# Patient Record
Sex: Female | Born: 2000 | Race: White | Hispanic: No | Marital: Single | Smoking: Never smoker
Health system: Southern US, Community
[De-identification: ages and names within clinical notes are randomized; demographics above are authoritative.]

## PROBLEM LIST (undated history)

## (undated) DIAGNOSIS — F419 Anxiety disorder, unspecified: Secondary | ICD-10-CM

## (undated) DIAGNOSIS — F988 Other specified behavioral and emotional disorders with onset usually occurring in childhood and adolescence: Secondary | ICD-10-CM

## (undated) DIAGNOSIS — N390 Urinary tract infection, site not specified: Secondary | ICD-10-CM

## (undated) DIAGNOSIS — K219 Gastro-esophageal reflux disease without esophagitis: Secondary | ICD-10-CM

## (undated) DIAGNOSIS — N200 Calculus of kidney: Secondary | ICD-10-CM

## (undated) DIAGNOSIS — R519 Headache, unspecified: Secondary | ICD-10-CM

## (undated) DIAGNOSIS — N189 Chronic kidney disease, unspecified: Secondary | ICD-10-CM

## (undated) DIAGNOSIS — J45909 Unspecified asthma, uncomplicated: Secondary | ICD-10-CM

## (undated) HISTORY — PX: BREAST SURGERY: SHX581

## (undated) HISTORY — DX: Other specified behavioral and emotional disorders with onset usually occurring in childhood and adolescence: F98.8

## (undated) HISTORY — DX: Unspecified asthma, uncomplicated: J45.909

## (undated) HISTORY — DX: Urinary tract infection, site not specified: N39.0

## (undated) HISTORY — DX: Anxiety disorder, unspecified: F41.9

## (undated) HISTORY — DX: Headache, unspecified: R51.9

## (undated) HISTORY — DX: Chronic kidney disease, unspecified: N18.9

## (undated) HISTORY — PX: WISDOM TOOTH EXTRACTION: SHX21

## (undated) HISTORY — DX: Gastro-esophageal reflux disease without esophagitis: K21.9

## (undated) HISTORY — PX: BREAST REDUCTION SURGERY: SHX8

---
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
## 2018-01-10 DIAGNOSIS — L304 Erythema intertrigo: Secondary | ICD-10-CM | POA: Diagnosis not present

## 2018-01-10 DIAGNOSIS — L308 Other specified dermatitis: Secondary | ICD-10-CM | POA: Diagnosis not present

## 2018-02-18 DIAGNOSIS — N62 Hypertrophy of breast: Secondary | ICD-10-CM | POA: Diagnosis not present

## 2018-04-26 DIAGNOSIS — Z68.41 Body mass index (BMI) pediatric, 5th percentile to less than 85th percentile for age: Secondary | ICD-10-CM | POA: Diagnosis not present

## 2018-04-26 DIAGNOSIS — Z713 Dietary counseling and surveillance: Secondary | ICD-10-CM | POA: Diagnosis not present

## 2018-04-26 DIAGNOSIS — Z0000 Encounter for general adult medical examination without abnormal findings: Secondary | ICD-10-CM | POA: Diagnosis not present

## 2018-06-30 DIAGNOSIS — Z3046 Encounter for surveillance of implantable subdermal contraceptive: Secondary | ICD-10-CM | POA: Diagnosis not present

## 2019-01-02 ENCOUNTER — Other Ambulatory Visit: Payer: Self-pay | Admitting: *Deleted

## 2019-01-02 DIAGNOSIS — Z20822 Contact with and (suspected) exposure to covid-19: Secondary | ICD-10-CM

## 2019-01-03 LAB — NOVEL CORONAVIRUS, NAA: SARS-CoV-2, NAA: NOT DETECTED

## 2019-02-03 ENCOUNTER — Other Ambulatory Visit: Payer: Self-pay

## 2019-02-03 DIAGNOSIS — Z20822 Contact with and (suspected) exposure to covid-19: Secondary | ICD-10-CM

## 2019-02-04 LAB — NOVEL CORONAVIRUS, NAA: SARS-CoV-2, NAA: NOT DETECTED

## 2019-05-04 ENCOUNTER — Other Ambulatory Visit: Payer: Self-pay

## 2019-05-04 ENCOUNTER — Emergency Department
Admission: EM | Admit: 2019-05-04 | Discharge: 2019-05-04 | Disposition: A | Discharge status: Home / Self Care | Payer: 59 | Attending: Emergency Medicine | Admitting: Emergency Medicine

## 2019-05-04 ENCOUNTER — Encounter: Payer: Self-pay | Admitting: Emergency Medicine

## 2019-05-04 ENCOUNTER — Emergency Department: Payer: 59

## 2019-05-04 DIAGNOSIS — Z79899 Other long term (current) drug therapy: Secondary | ICD-10-CM | POA: Insufficient documentation

## 2019-05-04 DIAGNOSIS — R109 Unspecified abdominal pain: Secondary | ICD-10-CM | POA: Diagnosis present

## 2019-05-04 DIAGNOSIS — N201 Calculus of ureter: Secondary | ICD-10-CM

## 2019-05-04 LAB — URINALYSIS, COMPLETE (UACMP) WITH MICROSCOPIC
Bilirubin Urine: NEGATIVE
Glucose, UA: NEGATIVE mg/dL
Ketones, ur: NEGATIVE mg/dL
Leukocytes,Ua: NEGATIVE
Nitrite: NEGATIVE
Protein, ur: 30 mg/dL — AB
RBC / HPF: >50 RBC/hpf — ABNORMAL HIGH (ref 0–5)
Specific Gravity, Urine: 1.027 (ref 1.005–1.030)
pH: 5 (ref 5.0–8.0)

## 2019-05-04 LAB — COMPREHENSIVE METABOLIC PANEL
ALT: 18 U/L (ref 0–44)
AST: 15 U/L (ref 15–41)
Albumin: 4.1 g/dL (ref 3.5–5.0)
Alkaline Phosphatase: 60 U/L (ref 38–126)
Anion gap: 7 (ref 5–15)
BUN: 16 mg/dL (ref 6–20)
CO2: 26 mmol/L (ref 22–32)
Calcium: 9.2 mg/dL (ref 8.9–10.3)
Chloride: 107 mmol/L (ref 98–111)
Creatinine, Ser: 0.64 mg/dL (ref 0.44–1.00)
GFR calc Af Amer: >60 mL/min (ref >60)
GFR calc non Af Amer: >60 mL/min (ref >60)
Glucose, Bld: 108 mg/dL — ABNORMAL HIGH (ref 70–99)
Potassium: 3.7 mmol/L (ref 3.5–5.1)
Sodium: 140 mmol/L (ref 135–145)
Total Bilirubin: 0.7 mg/dL (ref 0.3–1.2)
Total Protein: 6.9 g/dL (ref 6.5–8.1)

## 2019-05-04 LAB — CBC WITH DIFFERENTIAL/PLATELET
Abs Immature Granulocytes: 0.01 10*3/uL (ref 0.00–0.07)
Basophils Absolute: 0 10*3/uL (ref 0.0–0.1)
Basophils Relative: 1 %
Eosinophils Absolute: 0.1 10*3/uL (ref 0.0–0.5)
Eosinophils Relative: 1 %
HCT: 44 % (ref 36.0–46.0)
Hemoglobin: 14.6 g/dL (ref 12.0–15.0)
Immature Granulocytes: 0 %
Lymphocytes Relative: 35 %
Lymphs Abs: 2.9 10*3/uL (ref 0.7–4.0)
MCH: 29 pg (ref 26.0–34.0)
MCHC: 33.2 g/dL (ref 30.0–36.0)
MCV: 87.5 fL (ref 80.0–100.0)
Monocytes Absolute: 0.8 10*3/uL (ref 0.1–1.0)
Monocytes Relative: 10 %
Neutro Abs: 4.4 10*3/uL (ref 1.7–7.7)
Neutrophils Relative %: 53 %
Platelets: 322 10*3/uL (ref 150–400)
RBC: 5.03 MIL/uL (ref 3.87–5.11)
RDW: 11.8 % (ref 11.5–15.5)
WBC: 8.2 10*3/uL (ref 4.0–10.5)
nRBC: 0 % (ref 0.0–0.2)

## 2019-05-04 LAB — POCT PREGNANCY, URINE: Preg Test, Ur: NEGATIVE

## 2019-05-04 MED ORDER — ONDANSETRON HCL 4 MG/2ML IJ SOLN
4.0000 mg | Freq: Once | INTRAMUSCULAR | Status: AC
  Administered 2019-05-04: 03:00:00 4 mg via INTRAVENOUS
  Filled 2019-05-04: qty 2

## 2019-05-04 MED ORDER — ONDANSETRON 4 MG PO TBDP: 4.0000 mg | ORAL_TABLET | Freq: Three times a day (TID) | ORAL | 0 refills | Status: DC | PRN

## 2019-05-04 MED ORDER — KETOROLAC TROMETHAMINE 30 MG/ML IJ SOLN
30.0000 mg | Freq: Once | INTRAMUSCULAR | Status: AC
  Administered 2019-05-04: 04:00:00 30 mg via INTRAVENOUS
  Filled 2019-05-04: qty 1

## 2019-05-04 MED ORDER — SODIUM CHLORIDE 0.9 % IV BOLUS
1000.0000 mL | Freq: Once | INTRAVENOUS | Status: AC
  Administered 2019-05-04: 04:00:00 1000 mL via INTRAVENOUS

## 2019-05-04 MED ORDER — OXYCODONE-ACETAMINOPHEN 5-325 MG PO TABS: 1.0000 | ORAL_TABLET | ORAL | 0 refills | Status: DC | PRN

## 2019-05-04 MED ORDER — TAMSULOSIN HCL 0.4 MG PO CAPS: 0.4000 mg | ORAL_CAPSULE | Freq: Every day | ORAL | 0 refills | Status: AC

## 2019-05-04 MED ORDER — MORPHINE SULFATE (PF) 4 MG/ML IV SOLN
4.0000 mg | Freq: Once | INTRAVENOUS | Status: AC
  Administered 2019-05-04: 03:00:00 2 mg via INTRAVENOUS
  Filled 2019-05-04: qty 1

## 2019-05-04 NOTE — ED Provider Notes (Signed)
[facility] Emergency Department Provider Note  ____________________________________________   First MD Initiated Contact with Patient 05/04/19 [phone]     (approximate)  I have reviewed the triage vital signs and the nursing notes.   HISTORY  Chief Complaint Flank Pain    HPI Kelly Bennett is a 19 y.o. female with below list of previous medical conditions presents to the emergency department secondary to intermittent right flank pain x3 days with acute worsening tonight at 1:45 AM.  Patient states that pain was 9 out of 10 currently 7 out of 10.  Patient states that pain improved since taking meloxicam and hydrocodone before arrival to the emergency department.  Patient denies any hematuria dysuria fever.  Patient denies any nausea or vomiting.        History reviewed. No pertinent past medical history.  Patient Active Problem List   Diagnosis Date Noted  . Acute right ankle pain 07/05/2016  . Hypermobility syndrome 07/05/2016    Past Surgical History:  Procedure Laterality Date  . BREAST REDUCTION SURGERY    . WISDOM TOOTH EXTRACTION      Prior to Admission medications   Medication Sig Start Date End Date Taking? Authorizing Provider  amphetamine-dextroamphetamine (ADDERALL) 5 MG tablet Take 5 mg by mouth daily.    [provider]  lisdexamfetamine (VYVANSE) 40 MG capsule Take 40 mg by mouth once.    [provider]  meloxicam (MOBIC) 15 MG tablet Take 1 tablet (15 mg total) by mouth daily. Patient not taking: Reported on 07/14/2017 05/06/17   Cuthriell, Delorise Royals, PA-C    Allergies Patient has no known allergies.  No family history on file.  Social History Social History   Tobacco Use  . Smoking status: Never Smoker  . Smokeless tobacco: Never Used  Substance Use Topics  . Alcohol use: No  . Drug use: No    Review of Systems Constitutional: No fever/chills Eyes: No visual changes. ENT: No sore throat.  Cardiovascular: Denies chest pain. Respiratory: Denies shortness of breath. Gastrointestinal: Positive for abdominal and right flank pain no nausea, no vomiting.  No diarrhea.  No constipation. Genitourinary: Negative for dysuria. Musculoskeletal: Negative for neck pain.  Negative for back pain. Integumentary: Negative for rash. Neurological: Negative for headaches, focal weakness or numbness.  ____________________________________________   PHYSICAL EXAM:  VITAL SIGNS: ED Triage Vitals  Enc Vitals Group     BP 05/04/19 0213 127/82     Pulse Rate 05/04/19 0213 82     Resp 05/04/19 0213 18     Temp 05/04/19 0213 97.9 F (36.6 C)     Temp Source 05/04/19 0213 Oral     SpO2 05/04/19 0213 96 %     Weight 05/04/19 0214 77.1 kg (170 lb)     Height 05/04/19 0214 1.676 m (5\' 6" )     Head Circumference --      Peak Flow --      Pain Score 05/04/19 0224 9     Pain Loc --      Pain Edu? --      Excl. in GC? --     Constitutional: Alert and oriented.  Eyes: Conjunctivae are normal.  Mouth/Throat: Patient is wearing a mask. Neck: No stridor.  No meningeal signs.   Cardiovascular: Normal rate, regular rhythm. Good peripheral circulation. Grossly normal heart sounds. Respiratory: Normal respiratory effort.  No retractions. Gastrointestinal: Soft and nontender. No distention.  Musculoskeletal: No lower extremity tenderness nor edema. No gross deformities of  extremities. Neurologic:  Normal speech and language. No gross focal neurologic deficits are appreciated.  Skin:  Skin is warm, dry and intact. Psychiatric: Mood and affect are normal. Speech and behavior are normal.  ____________________________________________   LABS (all labs ordered are listed, but only abnormal results are displayed)  Labs Reviewed  COMPREHENSIVE METABOLIC PANEL - Abnormal; Notable for the following components:      Result Value   Glucose, Bld 108 (*)    All other components within normal limits   URINALYSIS, COMPLETE (UACMP) WITH MICROSCOPIC - Abnormal; Notable for the following components:   Color, Urine YELLOW (*)    APPearance HAZY (*)    Hgb urine dipstick LARGE (*)    Protein, ur 30 (*)    RBC / HPF >50 (*)    Bacteria, UA RARE (*)    All other components within normal limits  CBC WITH DIFFERENTIAL/PLATELET  POCT PREGNANCY, URINE     RADIOLOGY I, [place], personally viewed and evaluated these images (plain radiographs) as part of my medical decision making, as well as reviewing the written report by the radiologist.  ED MD interpretation: 3 mm proximal ureteral lithiasis with hydronephrosis per radiologist on CT impression per radiologist.  Official radiology report(s): CT Renal Stone Study  Result Date: 05/04/2019 CLINICAL DATA:  Right-sided flank pain EXAM: CT ABDOMEN AND PELVIS WITHOUT CONTRAST TECHNIQUE: Multidetector CT imaging of the abdomen and pelvis was performed following the standard protocol without IV contrast. COMPARISON:  None. FINDINGS: Lower chest: No acute abnormality. Hepatobiliary: No focal liver abnormality is seen. No gallstones, gallbladder wall thickening, or biliary dilatation. Pancreas: Unremarkable. No pancreatic ductal dilatation or surrounding inflammatory changes. Spleen: Normal in size without focal abnormality. Adrenals/Urinary Tract: Adrenal glands are within normal limits. Left kidney is unremarkable. Right kidney demonstrates hydronephrosis secondary to a small proximal right ureteral stone measuring 2-3 mm. The more distal right ureter appears within normal limits. Bladder is decompressed. Stomach/Bowel: The appendix is within normal limits. No obstructive or inflammatory changes of the large or small bowel are seen. Stomach is within normal limits. Vascular/Lymphatic: No significant vascular findings are present. No enlarged abdominal or pelvic lymph nodes. Reproductive: Uterus and bilateral adnexa are unremarkable. Other: No abdominal  wall hernia or abnormality. No abdominopelvic ascites. Musculoskeletal: No acute or significant osseous findings. IMPRESSION: Proximal right ureteral stone with hydronephrosis. Electronically Signed   By: Alcide Clever M.D.   On: 05/04/2019 03:08    ______  Procedures   ____________________________________________   INITIAL IMPRESSION / MDM / ASSESSMENT AND PLAN / ED COURSE  As part of my medical decision making, I reviewed the following data within the electronic MEDICAL RECORD NUMBER   18 year old female presented with above-stated history and physical exam consistent with possible cholelithiasis which was confirmed on CT.  Patient given IV morphine 2 mg, Zofran 4 mg IV and Toradol 30 mg IV.  1 L IV normal saline administered.  Pain improved at this time.  Patient will be referred to urology for further outpatient evaluation       ____________________________________________  FINAL CLINICAL IMPRESSION(S) / ED DIAGNOSES  Final diagnoses:  Ureterolithiasis     MEDICATIONS GIVEN DURING THIS VISIT:  Medications  ketorolac (TORADOL) 30 MG/ML injection 30 mg (has no administration in time range)  sodium chloride 0.9 % bolus 1,000 mL (has no administration in time range)  morphine 4 MG/ML injection 4 mg (2 mg Intravenous Given 05/04/19 0316)  ondansetron (ZOFRAN) injection 4 mg (4 mg Intravenous  Given 05/04/19 0316)     ED Discharge Orders    None      *Please note:  Adamary Savary was evaluated in Emergency Department on 05/04/2019 for the symptoms described in the history of present illness. She was evaluated in the context of the global COVID-19 pandemic, which necessitated consideration that the patient might be at risk for infection with the SARS-CoV-2 virus that causes COVID-19. Institutional protocols and algorithms that pertain to the evaluation of patients at risk for COVID-19 are in a state of rapid change based on information released by regulatory bodies including the CDC and  federal and state organizations. These policies and algorithms were followed during the patient's care in the ED.  Some ED evaluations and interventions may be delayed as a result of limited staffing during the pandemic.*  Note:  This document was prepared using Dragon voice recognition software and may include unintentional dictation errors.   Gregor Hams, MD 05/04/19 [phone]

## 2019-05-04 NOTE — ED Triage Notes (Signed)
Pt to triage via w/c with no distress noted, mask in place; pt reports rt flank pain radiating into abd tonight accomp by nausea; awoke at 145am with increasing pain; meloxicam and hydrocodone PTA without relief; denies hx of same

## 2019-05-04 NOTE — ED Notes (Signed)
Pt transferred to CT.

## 2019-05-25 ENCOUNTER — Other Ambulatory Visit: Payer: 59

## 2019-09-27 ENCOUNTER — Ambulatory Visit (INDEPENDENT_AMBULATORY_CARE_PROVIDER_SITE_OTHER): Payer: 59 | Admitting: Nurse Practitioner

## 2019-09-27 ENCOUNTER — Encounter: Payer: Self-pay | Admitting: Nurse Practitioner

## 2019-09-27 ENCOUNTER — Other Ambulatory Visit: Payer: Self-pay

## 2019-09-27 VITALS — BP 112/74 | HR 81 | Temp 98.1°F | Ht 66.0 in | Wt 182.0 lb

## 2019-09-27 DIAGNOSIS — Z1159 Encounter for screening for other viral diseases: Secondary | ICD-10-CM | POA: Diagnosis not present

## 2019-09-27 DIAGNOSIS — F988 Other specified behavioral and emotional disorders with onset usually occurring in childhood and adolescence: Secondary | ICD-10-CM | POA: Diagnosis not present

## 2019-09-27 DIAGNOSIS — Z0000 Encounter for general adult medical examination without abnormal findings: Secondary | ICD-10-CM | POA: Diagnosis not present

## 2019-09-27 DIAGNOSIS — Z23 Encounter for immunization: Secondary | ICD-10-CM | POA: Diagnosis not present

## 2019-09-27 DIAGNOSIS — E538 Deficiency of other specified B group vitamins: Secondary | ICD-10-CM

## 2019-09-27 DIAGNOSIS — F419 Anxiety disorder, unspecified: Secondary | ICD-10-CM | POA: Diagnosis not present

## 2019-09-27 LAB — LIPID PANEL
Cholesterol: 128 mg/dL (ref 0–200)
HDL: 60.8 mg/dL (ref >39.00)
LDL Cholesterol: 56 mg/dL (ref 0–99)
NonHDL: 66.97
Total CHOL/HDL Ratio: 2
Triglycerides: 56 mg/dL (ref 0.0–149.0)
VLDL: 11.2 mg/dL (ref 0.0–40.0)

## 2019-09-27 LAB — TSH: TSH: 1.5 u[IU]/mL (ref 0.35–4.50)

## 2019-09-27 LAB — B12 AND FOLATE PANEL
Folate: 17.3 ng/mL (ref >5.9)
Vitamin B-12: 180 pg/mL — ABNORMAL LOW (ref 211–911)

## 2019-09-27 LAB — VITAMIN D 25 HYDROXY (VIT D DEFICIENCY, FRACTURES): VITD: 30.41 ng/mL (ref 30.00–100.00)

## 2019-09-27 LAB — HEMOGLOBIN A1C: Hgb A1c MFr Bld: 5.3 % (ref 4.6–6.5)

## 2019-09-27 NOTE — Patient Instructions (Addendum)
Please go to the lab today.  I have placed referral to psychiatry- Dr. Shea Evans for anxiety and ADD evaluation and management.  Continue with healthy lifestyle, diet and exercise regular sunscreen use.  Visit the dentist every 56-monththe eye doctor once a year.  For your first Pap smear at age [age]please arrange appointment with GYN  at your convenience.   You are due for the tetanus vaccine now- Tdap is recommended.     Preventive Care 152271Years Old, Female Preventive care refers to lifestyle choices and visits with your health care provider that can promote health and wellness. At this stage in your life, you may start seeing a primary care physician instead of a pediatrician. Your health care is now your responsibility. Preventive care for young adults includes:  A yearly physical exam. This is also called an annual wellness visit.  Regular dental and eye exams.  Immunizations.  Screening for certain conditions.  Healthy lifestyle choices, such as diet and exercise. What can I expect for my preventive care visit? Physical exam Your health care provider may check:  Height and weight. These may be used to calculate body mass index (BMI), which is a measurement that tells if you are at a healthy weight.  Heart rate and blood pressure.  Body temperature. Counseling Your health care provider may ask you questions about:  Past medical problems and family medical history.  Alcohol, tobacco, and drug use.  Home and relationship well-being.  Access to firearms.  Emotional well-being.  Diet, exercise, and sleep habits.  Sexual activity and sexual health.  Method of birth control.  Menstrual cycle.  Pregnancy history. What immunizations do I need?  Influenza (flu) vaccine  This is recommended every year. Tetanus, diphtheria, and pertussis (Tdap) vaccine  You may need a Td booster every 10 years. Varicella (chickenpox) vaccine  You may need this vaccine if you  have not already been vaccinated. Human papillomavirus (HPV) vaccine  If recommended by your health care provider, you may need three doses over 6 months. Measles, mumps, and rubella (MMR) vaccine  You may need at least one dose of MMR. You may also need a second dose. Meningococcal conjugate (MenACWY) vaccine  One dose is recommended if you are 89221years old and a fMarket researcherliving in a residence hall, or if you have one of several medical conditions. You may also need additional booster doses. Pneumococcal conjugate (PCV13) vaccine  You may need this if you have certain conditions and were not previously vaccinated. Pneumococcal polysaccharide (PPSV23) vaccine  You may need one or two doses if you smoke cigarettes or if you have certain conditions. Hepatitis A vaccine  You may need this if you have certain conditions or if you travel or work in places where you may be exposed to hepatitis A. Hepatitis B vaccine  You may need this if you have certain conditions or if you travel or work in places where you may be exposed to hepatitis B. Haemophilus influenzae type b (Hib) vaccine  You may need this if you have certain risk factors. You may receive vaccines as individual doses or as more than one vaccine together in one shot (combination vaccines). Talk with your health care provider about the risks and benefits of combination vaccines. What tests do I need? Blood tests  Lipid and cholesterol levels. These may be checked every 5 years starting at age 21  Hepatitis C test.  Hepatitis B test. Screening  Pelvic exam and  Pap test. This may be done every 3 years starting at age 50.  Sexually transmitted disease (STD) testing, if you are at risk.  BRCA-related cancer screening. This may be done if you have a family history of breast, ovarian, tubal, or peritoneal cancers. Other tests  Tuberculosis skin test.  Vision and hearing tests.  Skin exam.  Breast  exam. Follow these instructions at home: Eating and drinking   Eat a diet that includes fresh fruits and vegetables, whole grains, lean protein, and low-fat dairy products.  Drink enough fluid to keep your urine pale yellow.  Do not drink alcohol if: ? Your health care provider tells you not to drink. ? You are pregnant, may be pregnant, or are planning to become pregnant. ? You are under the legal drinking age. In the [place] the legal drinking age is 2.  If you drink alcohol: ? Limit how much you have to 0-1 drink a day. ? Be aware of how much alcohol is in your drink. In the [place] one drink equals one 12 oz bottle of beer (355 mL), one 5 oz glass of wine (148 mL), or one 1 oz glass of hard liquor (44 mL). Lifestyle  Take daily care of your teeth and gums.  Stay active. Exercise at least 30 minutes 5 or more days of the week.  Do not use any products that contain nicotine or tobacco, such as cigarettes, e-cigarettes, and chewing tobacco. If you need help quitting, ask your health care provider.  Do not use drugs.  If you are sexually active, practice safe sex. Use a condom or other form of birth control (contraception) in order to prevent pregnancy and STIs (sexually transmitted infections). If you plan to become pregnant, see your health care provider for a pre-conception visit.  Find healthy ways to cope with stress, such as: ? Meditation, yoga, or listening to music. ? Journaling. ? Talking to a trusted person. ? Spending time with friends and family. Safety  Always wear your seat belt while driving or riding in a vehicle.  Do not drive if you have been drinking alcohol. Do not ride with someone who has been drinking.  Do not drive when you are tired or distracted. Do not text while driving.  Wear a helmet and other protective equipment during sports activities.  If you have firearms in your house, make sure you follow all gun safety procedures.  Seek help if you  have been bullied, physically abused, or sexually abused.  Use the Internet responsibly to avoid dangers such as online bullying and online sex predators. What's next?  Go to your health care provider once a year for a well check visit.  Ask your health care provider how often you should have your eyes and teeth checked.  Stay up to date on all vaccines. This information is not intended to replace advice given to you by your health care provider. Make sure you discuss any questions you have with your health care provider. Document Revised: 02/10/2018 Document Reviewed: 02/10/2018 Elsevier Patient Education  2020 [employer].

## 2019-09-27 NOTE — Progress Notes (Signed)
New Patient Office Visit  Subjective:  Patient ID: Kelly Bennett, female    DOB: 06-30-2000  Age: 19 y.o. MRN: [id number]  CC:  Chief Complaint  Patient presents with  . New Patient (Initial Visit)    establish care    HPI Kelly Bennett is a healthy 19 year old with past history of asthma, ADHD, anxiety, GERD, nephrolithiasis, Covid infection who presents to establish care with primary care provider.  She needs to have a routine physical exam with her first  pap test.  She comes in today with her mother Kelly Bennett.      Asthma: Patient has a history of mild asthma if she overexerted herself.  She has no specific allergies.  She reports a mild case of Covid viral infection in March 2021.  She does keep an albuterol inhaler on hand.  She is currently asymptomatic.  Non-smoker.  ADHD: Diagnosed and treated by her pediatric attending.  She is currently on no medication and does not desire to go back on medication at this time.  She did take Vyvanse 50 mg for 3 years in 2017 and loved the medication as it controlled her ADHD when she was in school. She would take an additional Adderall 15 mg in the evening if needed to help her with school work. Insurance stopped covering it. She then tried Focalin and took that for one week,  but it made her feel like a walking zombie so she stopped it.  She has found ways to manage her ADHD without medication and is doing well.  She will have anxiety that comes and goes.  She can wake up in the morning before her day has begun and can tell whether it is going to be in anxiety daily or not.  She manages this with lifestyle. She feels she is doing very well overall. PHQ-9: 16 and GAD-7: 16. No SI/HI.    Social: Patient single, living with her parents, planning on getting married 12/14/2020.  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  History of previous tobacco use.  Reports social alcohol.   Immunizations: UTD. She tested positive for Covid 05/2019.She has  declined the Covid vaccine at this time.  Diet:healthy, not a vegan, eats bread Exercise: Pap Smear:not yet. She has a Nexplanon in place for birth control. Dentist: UTD Vision: UTD-contacts  Past Medical History:  Diagnosis Date  . ADD (attention deficit disorder)    Diagnosed age 62 - probably showed signs of not focusing, short attn span, easily distracted  . Anxiety   . Asthma   . Chronic kidney disease    kidney stones 06/2019- maybe passed them  . Frequent headaches   . GERD (gastroesophageal reflux disease)   . UTI (urinary tract infection)     Past Surgical History:  Procedure Laterality Date  . BREAST REDUCTION SURGERY    . WISDOM TOOTH EXTRACTION      Family History  Problem Relation Age of Onset  . Arthritis Mother   . Depression Mother   . Hypertension Mother   . Miscarriages / [place] Mother   . Arthritis Father   . Cancer Father   . Hypertension Father   . Arthritis Maternal Grandmother   . Hypertension Maternal Grandmother   . Alcohol abuse Maternal Grandfather     Social History   Socioeconomic History  . Marital status: Single    Spouse name: Not on file  . Number of children: Not on file  . Years of education:  Not on file  . Highest education level: High school graduate  Occupational History  . Occupation: Leisure centre managerbartender  . Occupation: retail   Tobacco Use  . Smoking status: Former Games developermoker  . Smokeless tobacco: Never Used  Vaping Use  . Vaping Use: Never used  Substance and Sexual Activity  . Alcohol use: Yes  . Drug use: Never  . Sexual activity: Not Currently    Birth control/protection: Implant  Other Topics Concern  . Not on file  Social History Narrative   Single, living with parents, getting married 12/14/2020   Social Determinants of Health   Financial Resource Strain:   . Difficulty of Paying Living Expenses:   Food Insecurity:   . Worried About Programme researcher, broadcasting/film/videounning Out of Food in the Last Year:   . Baristaan Out of Food in the Last Year:     Transportation Needs:   . Freight forwarderLack of Transportation (Medical):   Marland Kitchen. Lack of Transportation (Non-Medical):   Physical Activity:   . Days of Exercise per Week:   . Minutes of Exercise per Session:   Stress:   . Feeling of Stress :   Social Connections:   . Frequency of Communication with Friends and Family:   . Frequency of Social Gatherings with Friends and Family:   . Attends Religious Services:   . Active Member of Clubs or Organizations:   . Attends BankerClub or Organization Meetings:   Marland Kitchen. Marital Status:   Intimate Partner Violence:   . Fear of Current or Ex-Partner:   . Emotionally Abused:   Marland Kitchen. Physically Abused:   . Sexually Abused:     ROS Review of Systems  Constitutional: Negative for chills and fever.  HENT: Negative.   Eyes: Negative.   Respiratory: Negative.  Negative for cough, shortness of breath and wheezing.   Cardiovascular: Negative.   Gastrointestinal: Negative.  Negative for abdominal pain, blood in stool, diarrhea, nausea and vomiting.  Endocrine: Negative.   Genitourinary: Negative.        LMP June erratic on Nexplanon  Musculoskeletal: Negative.   Skin: Negative.   Allergic/Immunologic: Negative.   Neurological: Negative.   Hematological: Negative.   Psychiatric/Behavioral:       Positive per HPI    Objective:   Today's Vitals: BP 112/74 (BP Location: Left Arm, Patient Position: Sitting, Cuff Size: Normal)   Pulse 81   Temp 98.1 F (36.7 C) (Oral)   Ht 5\' 6"  (1.676 m)   Wt 182 lb (82.6 kg)   SpO2 98%   BMI 29.38 kg/m   Physical Exam Constitutional:      Appearance: Normal appearance. She is normal weight.  HENT:     Head: Normocephalic and atraumatic.     Right Ear: Tympanic membrane normal.     Left Ear: Tympanic membrane normal.  Eyes:     Conjunctiva/sclera: Conjunctivae normal.     Pupils: Pupils are equal, round, and reactive to light.  Cardiovascular:     Rate and Rhythm: Normal rate and regular rhythm.     Pulses: Normal pulses.      Heart sounds: Normal heart sounds.  Pulmonary:     Effort: Pulmonary effort is normal.     Breath sounds: Normal breath sounds.  Abdominal:     Palpations: Abdomen is soft.     Tenderness: There is no abdominal tenderness.  Musculoskeletal:        General: Normal range of motion.     Cervical back: Normal range of motion and neck supple.  Skin:    General: Skin is warm and dry.  Neurological:     General: No focal deficit present.     Mental Status: She is alert and oriented to person, place, and time.  Psychiatric:        Mood and Affect: Mood normal.        Behavior: Behavior normal.        Thought Content: Thought content normal.        Judgment: Judgment normal.     Comments: PHQ-9: 16 GAD-7: 16, No SI/HI.      Assessment & Plan:   Problem List Items Addressed This Visit    None    Visit Diagnoses    Encounter for medical examination to establish care    -  Primary   Relevant Orders   Lipid panel (Completed)   TSH (Completed)   Hemoglobin A1c (Completed)   Anxiety       Relevant Orders   VITAMIN D 25 Hydroxy (Vit-D Deficiency, Fractures) (Completed)   B12 and Folate Panel (Completed)   Ambulatory referral to Psychiatry   Attention deficit disorder, unspecified hyperactivity presence       Relevant Orders   Ambulatory referral to Psychiatry   Need for hepatitis C screening test       Relevant Orders   Hepatitis C Antibody (Completed)   Need for Tdap vaccination       Relevant Orders   Tdap vaccine greater than or equal to 7yo IM (Completed)   B12 deficiency       Relevant Orders   B12 and Folate Panel      Outpatient Encounter Medications as of 09/27/2019  Medication Sig  . albuterol (VENTOLIN HFA) 108 (90 Base) MCG/ACT inhaler Inhale into the lungs.  Marland Kitchen etonogestrel (NEXPLANON) 68 MG IMPL implant Inject into the skin.  . [DISCONTINUED] amphetamine-dextroamphetamine (ADDERALL) 5 MG tablet Take 5 mg by mouth daily.  . [DISCONTINUED] lisdexamfetamine  (VYVANSE) 40 MG capsule Take 40 mg by mouth once.  . [DISCONTINUED] meloxicam (MOBIC) 15 MG tablet Take 1 tablet (15 mg total) by mouth daily. (Patient not taking: Reported on 07/14/2017)  . [DISCONTINUED] ondansetron (ZOFRAN ODT) 4 MG disintegrating tablet Take 1 tablet (4 mg total) by mouth every 8 (eight) hours as needed.  . [DISCONTINUED] oxyCODONE-acetaminophen (PERCOCET) 5-325 MG tablet Take 1 tablet by mouth every 4 (four) hours as needed.   No facility-administered encounter medications on file as of 09/27/2019.   Please go to the lab today. We discussed her elevated anxiety and depression scores today. I have placed referral to psychiatry- Dr. Elna Breslow for anxiety and ADD evaluation and management.  Continue with healthy lifestyle, diet and exercise regular sunscreen use.   Visit the dentist every 33-month the eye doctor once a year.   She needs her first GYN exam and pap test. We can provide that here in primary care, but would send her to GYN when she needs the Nexplanon changed. She does have erratic periods with the Nexplanon. She decided to get established with GYN for her Pap and routine birth control needs and do it all in one location. She will make an appt at her convenience.    You are due for the tetanus vaccine now- Tdap is recommended.   10/01/2019: Addendum:  Lab results: Telephone call and she was given the normal Vit D, lipid, thyroid, glucose labs. B12 is low and advised to begin B12 1000 mcg daily and repeat B12 level in 3 mos. She  is in agreement and will begin the supplements.  Repeat B12 level in 3 mos.   10/01/2019: Addendum: We also discussed her history of right flank pain in March with proximal right ureteral stone with hydronephrosis on CT renal stone study 05/04/19:She thinks she passed the stone as her right flank pain resolved. She did not see the need to see Urology as recommended by the ED provider. Plan:  I advised that she see Urology to f/up on the hydronephrosis  seen.  She will consider this recommendation and will let me know if she wants a referral.   Follow-up: Return in about 1 year (around 09/26/2020).  This visit occurred during the SARS-CoV-2 public health emergency.  Safety protocols were in place, including screening questions prior to the visit, additional usage of staff PPE, and extensive cleaning of exam room while observing appropriate contact time as indicated for disinfecting solutions.   Amedeo Kinsman, NP

## 2019-09-28 LAB — HEPATITIS C ANTIBODY
Hepatitis C Ab: NONREACTIVE
SIGNAL TO CUT-OFF: 0.01 (ref <1.00)

## 2019-10-01 ENCOUNTER — Telehealth: Payer: Self-pay | Admitting: Nurse Practitioner

## 2019-10-01 ENCOUNTER — Encounter: Payer: Self-pay | Admitting: Nurse Practitioner

## 2019-10-01 NOTE — Telephone Encounter (Signed)
Lab results: She was given the normal Vit D, lipid, thyroid, glucose labs. B12 is low and advised to begin B12 1000 mcg daily and repeat B12 level in 3 mos. She is in agreement and will begin the supplements.  Repeat lab appt for 3 mos. The order is placed.  Hx of Rt flank pain in March with proximal right ureteral stone with hydronephrosis on CT renal stone study 05/04/19: Also, today, we reviewed her CT -renal stone study from 05/04/19 and she thinks she passed the stone as her right flank pain resolved. She did not see the need to see Urology as recommended by the ED provider.  Plan:  I advised that she see Urology to f/up on the hydronephrosis seen. She will consider this recommendation and will let me know if she wants a referral.

## 2019-11-02 ENCOUNTER — Telehealth: Payer: Self-pay | Admitting: Nurse Practitioner

## 2019-11-02 NOTE — Telephone Encounter (Signed)
LMTCB

## 2019-11-02 NOTE — Telephone Encounter (Signed)
Please call her and ask her to schedule an appt- or did she find a different provider?  

## 2019-11-02 NOTE — Telephone Encounter (Signed)
Rejection Reason - Patient did not respond" [facility] said about 18 hours ago 

## 2019-11-08 NOTE — Telephone Encounter (Signed)
LMTCB

## 2019-11-11 IMAGING — DX DG ANKLE COMPLETE 3+V*R*
3 series · 3 of 3 positions shown · non-contrast
Comparison: None.

CLINICAL DATA: Leg pain after fall

EXAM:
RIGHT ANKLE - COMPLETE 3+ VIEW

[ankle ap]
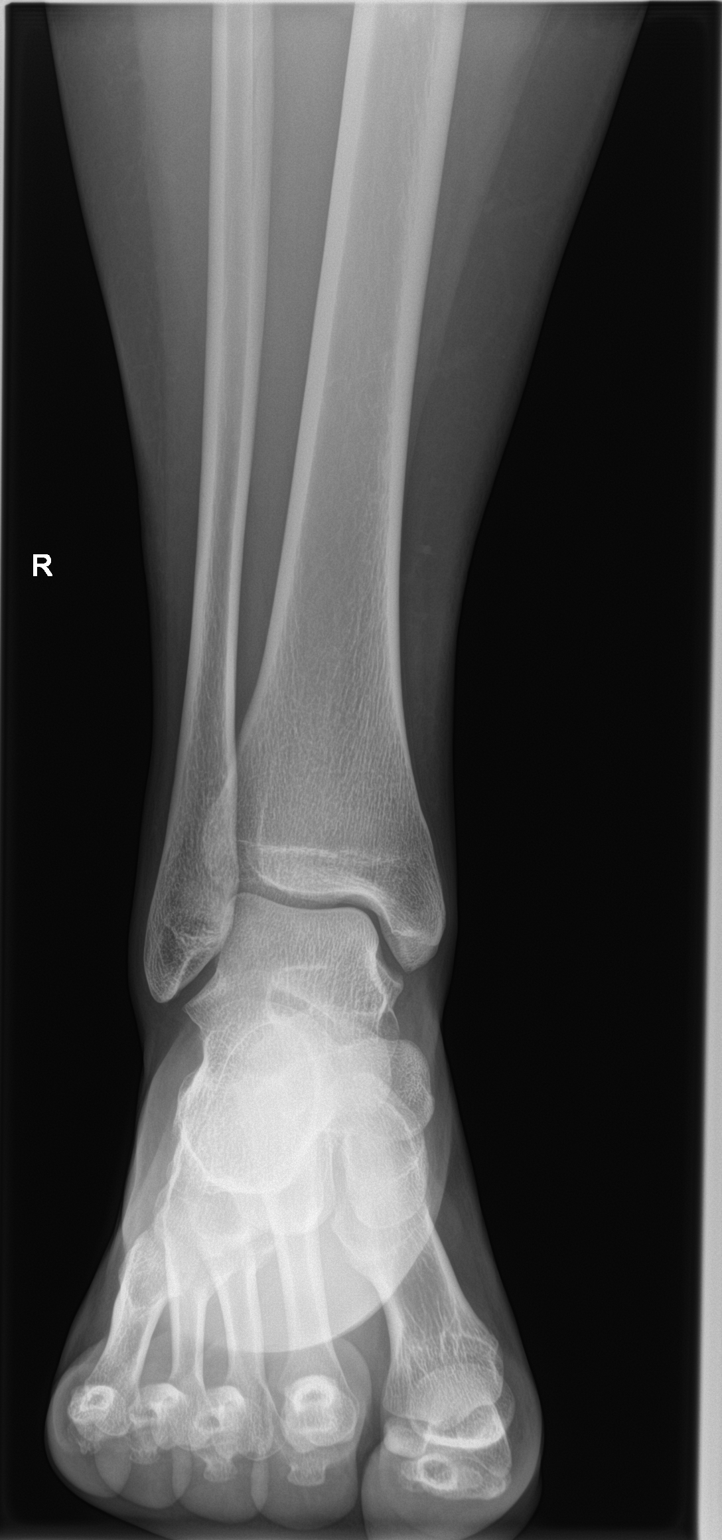

[ankle obl]
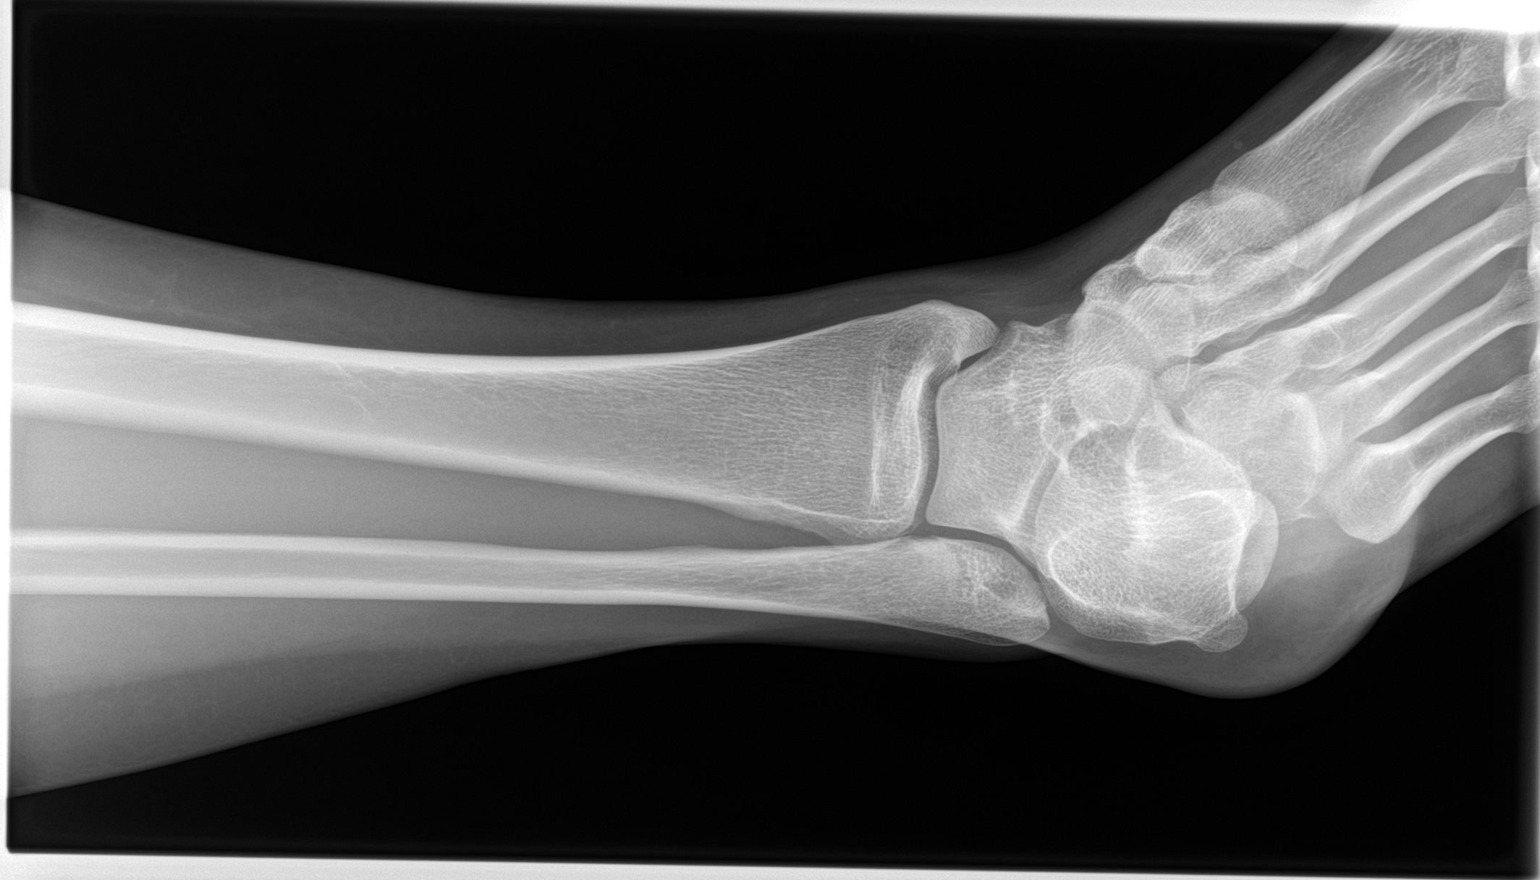

[ankle lat]
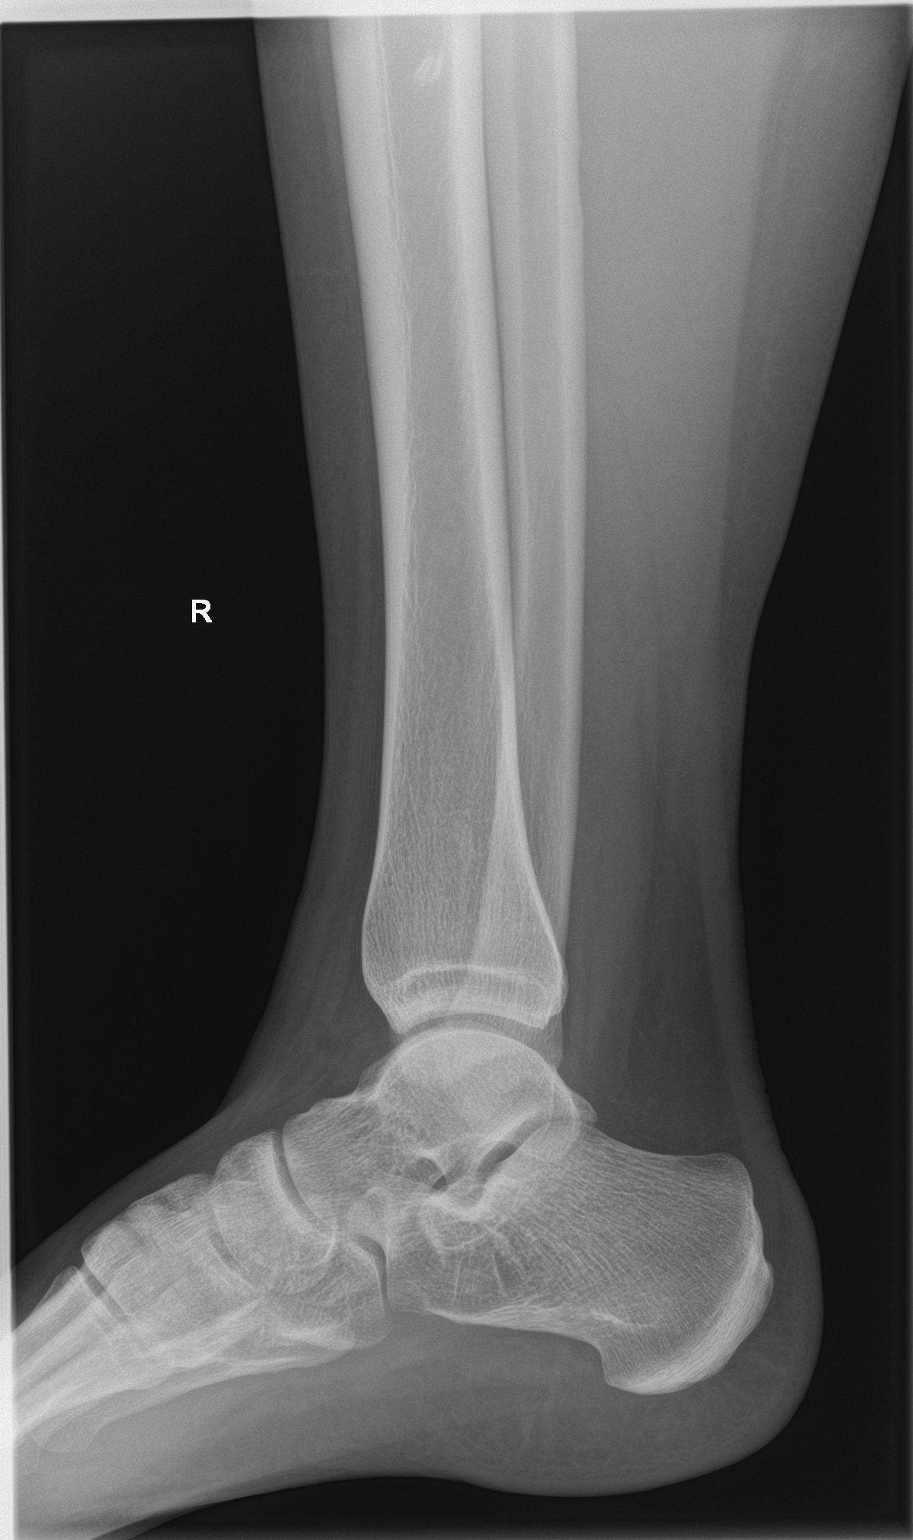

[3 of 3 positions shown; findings below may reference images not displayed]

FINDINGS: There is no evidence of fracture, dislocation, or joint effusion.
There is no evidence of arthropathy or other focal bone abnormality.
Soft tissues are unremarkable.
IMPRESSION: Negative.

## 2019-11-20 NOTE — Telephone Encounter (Signed)
LMTCB; letter sent to call office

## 2019-12-12 ENCOUNTER — Other Ambulatory Visit: Payer: 59

## 2020-01-18 ENCOUNTER — Telehealth: Payer: Self-pay | Admitting: Nurse Practitioner

## 2020-01-18 DIAGNOSIS — Z87448 Personal history of other diseases of urinary system: Secondary | ICD-10-CM

## 2020-01-18 DIAGNOSIS — Z87442 Personal history of urinary calculi: Secondary | ICD-10-CM

## 2020-01-18 NOTE — Telephone Encounter (Signed)
She needs a BMet to follow up on past right renal stone and hydronephrosis per ER CT imaging in March. She reported that she passed the stone and declined Urology referral at the time.  Since we last spoke, she has had no further kidney stone problem or bladder issues.  She is in agreement to get a Bmet drawn and see Urology for follow up and prefers a female provider.   Jessica: She needs lab appt.

## 2020-01-18 NOTE — Telephone Encounter (Signed)
Patient scheduled lab appt for 01/22/20 at 10:45 am.

## 2020-01-21 NOTE — Telephone Encounter (Signed)
Chart review.

## 2020-01-22 ENCOUNTER — Other Ambulatory Visit: Payer: 59

## 2020-02-07 ENCOUNTER — Ambulatory Visit: Payer: 59 | Admitting: Urology

## 2020-02-07 NOTE — Progress Notes (Deleted)
02/07/2020 8:32 AM   Kelly Bennett March 01, 2001 [id number]  Referring provider: Theadore Nan, NP No address on file  No chief complaint on file.   HPI:    PMH: Past Medical History:  Diagnosis Date  . ADD (attention deficit disorder)    Diagnosed age 19 - probably showed signs of not focusing, short attn span, easily distracted  . Anxiety   . Asthma   . Chronic kidney disease    kidney stones 06/2019- maybe passed them  . Frequent headaches   . GERD (gastroesophageal reflux disease)   . UTI (urinary tract infection)     Surgical History: Past Surgical History:  Procedure Laterality Date  . BREAST REDUCTION SURGERY    . WISDOM TOOTH EXTRACTION      Home Medications:  Allergies as of 02/07/2020   No Known Allergies     Medication List       Accurate as of February 07, 2020  8:32 AM. If you have any questions, ask your nurse or doctor.        albuterol 108 (90 Base) MCG/ACT inhaler Commonly known as: VENTOLIN HFA Inhale into the lungs.   Nexplanon 68 MG Impl implant Generic drug: etonogestrel Inject into the skin.       Allergies: No Known Allergies  Family History: Family History  Problem Relation Age of Onset  . Arthritis Mother   . Depression Mother   . Hypertension Mother   . Miscarriages / [place] Mother   . Arthritis Father   . Cancer Father   . Hypertension Father   . Arthritis Maternal Grandmother   . Hypertension Maternal Grandmother   . Alcohol abuse Maternal Grandfather     Social History:  reports that she has quit smoking. She has never used smokeless tobacco. She reports current alcohol use. She reports that she does not use drugs.   Physical Exam: There were no vitals taken for this visit.  Constitutional:  Alert and oriented, No acute distress. HEENT: [place] AT, moist mucus membranes.  Trachea midline, no masses. Cardiovascular: No clubbing, cyanosis, or edema. Respiratory: Normal respiratory effort, no increased work  of breathing. GI: Abdomen is soft, nontender, nondistended, no abdominal masses GU: No CVA tenderness Lymph: No cervical or inguinal lymphadenopathy. Skin: No rashes, bruises or suspicious lesions. Neurologic: Grossly intact, no focal deficits, moving all 4 extremities. Psychiatric: Normal mood and affect.  Laboratory Data: Lab Results  Component Value Date   WBC 8.2 05/04/2019   HGB 14.6 05/04/2019   HCT 44.0 05/04/2019   MCV 87.5 05/04/2019   PLT 322 05/04/2019    Lab Results  Component Value Date   CREATININE 0.64 05/04/2019    No results found for: PSA  No results found for: TESTOSTERONE  Lab Results  Component Value Date   HGBA1C 5.3 09/27/2019    Urinalysis    Component Value Date/Time   COLORURINE YELLOW (A) 05/04/2019 0215   APPEARANCEUR HAZY (A) 05/04/2019 0215   LABSPEC 1.027 05/04/2019 0215   PHURINE 5.0 05/04/2019 0215   GLUCOSEU NEGATIVE 05/04/2019 0215   HGBUR LARGE (A) 05/04/2019 0215   BILIRUBINUR NEGATIVE 05/04/2019 0215   KETONESUR NEGATIVE 05/04/2019 0215   PROTEINUR 30 (A) 05/04/2019 0215   NITRITE NEGATIVE 05/04/2019 0215   LEUKOCYTESUR NEGATIVE 05/04/2019 0215    Lab Results  Component Value Date   BACTERIA RARE (A) 05/04/2019    Pertinent Imaging: *** No results found for this or any previous visit.  No results found for this  or any previous visit.  No results found for this or any previous visit.  No results found for this or any previous visit.  No results found for this or any previous visit.  No results found for this or any previous visit.  No results found for this or any previous visit.  Results for orders placed during the hospital encounter of 05/04/19  CT Renal Stone Study  Narrative CLINICAL DATA:  Right-sided flank pain  EXAM: CT ABDOMEN AND PELVIS WITHOUT CONTRAST  TECHNIQUE: Multidetector CT imaging of the abdomen and pelvis was performed following the standard protocol without IV  contrast.  COMPARISON:  None.  FINDINGS: Lower chest: No acute abnormality.  Hepatobiliary: No focal liver abnormality is seen. No gallstones, gallbladder wall thickening, or biliary dilatation.  Pancreas: Unremarkable. No pancreatic ductal dilatation or surrounding inflammatory changes.  Spleen: Normal in size without focal abnormality.  Adrenals/Urinary Tract: Adrenal glands are within normal limits. Left kidney is unremarkable. Right kidney demonstrates hydronephrosis secondary to a small proximal right ureteral stone measuring 2-3 mm. The more distal right ureter appears within normal limits. Bladder is decompressed.  Stomach/Bowel: The appendix is within normal limits. No obstructive or inflammatory changes of the large or small bowel are seen. Stomach is within normal limits.  Vascular/Lymphatic: No significant vascular findings are present. No enlarged abdominal or pelvic lymph nodes.  Reproductive: Uterus and bilateral adnexa are unremarkable.  Other: No abdominal wall hernia or abnormality. No abdominopelvic ascites.  Musculoskeletal: No acute or significant osseous findings.  IMPRESSION: Proximal right ureteral stone with hydronephrosis.   Electronically Signed By: Alcide Clever M.D. On: 05/04/2019 03:08   Assessment & Plan:    There are no diagnoses linked to this encounter.  No follow-ups on file.  [name], MD  [facility] [address] [place], [place] [address] [phone]

## 2020-02-08 ENCOUNTER — Encounter: Payer: Self-pay | Admitting: Urology

## 2020-03-20 ENCOUNTER — Telehealth: Payer: Self-pay

## 2020-03-20 NOTE — Telephone Encounter (Signed)
This new pt appt was rescheduled for the 2nd time.   The earliest available appt. was March 24 with Osvaldo Angst.   She asking if she could be worked in for a sooner appointment if possible.  Thanks, [employer]

## 2020-03-21 NOTE — Telephone Encounter (Signed)
She has been rescheduled once. My apologies as I am ill and out of the clinic.

## 2020-03-26 ENCOUNTER — Ambulatory Visit: Payer: 59 | Admitting: Physician Assistant

## 2020-05-23 ENCOUNTER — Ambulatory Visit: Payer: 59 | Admitting: Physician Assistant

## 2020-06-24 ENCOUNTER — Ambulatory Visit: Payer: 59 | Admitting: Family Medicine

## 2020-06-26 DIAGNOSIS — J4520 Mild intermittent asthma, uncomplicated: Secondary | ICD-10-CM | POA: Insufficient documentation

## 2021-08-20 ENCOUNTER — Ambulatory Visit: Payer: 59 | Admitting: Internal Medicine

## 2021-09-12 ENCOUNTER — Ambulatory Visit: Payer: 59 | Admitting: Internal Medicine

## 2021-11-21 ENCOUNTER — Encounter: Payer: Self-pay | Admitting: Psychiatry

## 2021-11-21 ENCOUNTER — Ambulatory Visit (INDEPENDENT_AMBULATORY_CARE_PROVIDER_SITE_OTHER): Payer: 59 | Admitting: Psychiatry

## 2021-11-21 VITALS — BP 125/85 | HR 74 | Temp 98.7°F | Ht 66.0 in | Wt 214.0 lb

## 2021-11-21 DIAGNOSIS — R41840 Attention and concentration deficit: Secondary | ICD-10-CM | POA: Diagnosis not present
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
## 2021-11-21 NOTE — Patient Instructions (Signed)
[facility] [phone]

## 2022-01-05 ENCOUNTER — Other Ambulatory Visit: Payer: Self-pay | Admitting: Family Medicine

## 2022-01-05 DIAGNOSIS — N6323 Unspecified lump in the left breast, lower outer quadrant: Secondary | ICD-10-CM

## 2022-01-12 ENCOUNTER — Ambulatory Visit
Admission: RE | Admit: 2022-01-12 | Discharge: 2022-01-12 | Disposition: A | Discharge status: Home / Self Care | Payer: 59 | Source: Ambulatory Visit | Attending: Family Medicine | Admitting: Family Medicine

## 2022-01-12 DIAGNOSIS — N6323 Unspecified lump in the left breast, lower outer quadrant: Secondary | ICD-10-CM | POA: Diagnosis present

## 2022-03-04 ENCOUNTER — Ambulatory Visit (INDEPENDENT_AMBULATORY_CARE_PROVIDER_SITE_OTHER): Payer: 59 | Admitting: Psychology

## 2022-03-04 DIAGNOSIS — F89 Unspecified disorder of psychological development: Secondary | ICD-10-CM
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
## 2022-03-20 ENCOUNTER — Ambulatory Visit: Payer: 59 | Admitting: Psychology

## 2022-03-20 DIAGNOSIS — F89 Unspecified disorder of psychological development: Secondary | ICD-10-CM

## 2022-03-20 NOTE — Progress Notes (Signed)
Date: 03/20/2022   Appointment Start Time: 9am Duration: 80 minutes Provider: Clarice Pole, PsyD Type of Session: Testing Appointment for Evaluation  Location of Patient: Home Location of Provider: Provider's Home (private office) Type of Contact: WebEx video visit with audio  Session Content: Today's appointment was a telepsychological visit due to COVID-19. Kelly Bennett is aware it is her responsibility to secure confidentiality on her end of the session. Prior to proceeding with today's appointment, Kelly Bennett's physical location at the time of this appointment was obtained as well a phone number she could be reached at in the event of technical difficulties. Kelly Bennett denied anyone else being present in the room or on the virtual appointment. This provider reviewed that video should not be captured, photos should not be taken, nor should testing stimuli be copied or recorded as it would be a copyright violation. Kelly Bennett expressed understanding of the aforementioned, and verbally consented to proceed. The WAIS-IV was administered, scored, and interpreted by this evaluator  Billing codes will be input on the feedback appointment. There are no billing codes for the testing appointment.   Provisional DSM-5 diagnosis(es):  F89 Unspecified Disorder of Psychological Development   Plan: Kelly Bennett will be scheduled for a feedback upon completion of the remaining measures. She was agreeable with this plan.                Dolores Lory, PsyD

## 2022-03-26 ENCOUNTER — Ambulatory Visit (INDEPENDENT_AMBULATORY_CARE_PROVIDER_SITE_OTHER): Payer: 59 | Admitting: Psychology

## 2022-03-26 DIAGNOSIS — F902 Attention-deficit hyperactivity disorder, combined type: Secondary | ICD-10-CM | POA: Diagnosis not present

## 2022-03-26 NOTE — Progress Notes (Signed)
Testing and Report Writing Information: The following measures  were administered, scored, and interpreted by this provider:  Generalized Anxiety Disorder-7 (GAD-7; 5 minutes), Patient Health Questionnaire-9 (PHQ-9; 5 minutes), Wechsler Adult Intelligence Scale-Fourth Edition (WAIS-IV; 70 minutes), CNS Vital Signs (45 minutes), Adult Attention Deficit/Hyperactivity Disorder Self-Report Scale Checklist (ASRSv1.1; 15 minutes), Behavior Rating Inventory for Executive Function - A - Self Report (BRIEF A; 10 minutes) and Behavior Rating Inventory for Executive Function - A - Informant (BRIEF-A; 10 minutes), Adult OCD Inventory (OCD-A) SF-20 (15 minutes), Personality Assessment Inventory (PAI; 50 minutes). A total of 225 minutes was spent on the administration and scoring of the aforementioned measures. Codes [id number] and [phone] (6 units) were billed.  Please see the assessment for additional details. This provider completed the written report which includes integration of patient data, interpretation of standardized test results, interpretation of clinical data, review of information provided by Kelly Bennett and any collateral information/documentation, and clinical decision making (295 minutes in total).  Feedback Appointment: Date: 03/26/2022 Appointment Start Time: 1:03pm Duration: 52 minutes Provider: Clarice Pole, PsyD Type of Session: Feedback Appointment for Evaluation  Location of Patient: Home Location of Provider: Provider's Home (private office) Type of Contact: WebEx video visit with audio  Session Content: Today's appointment was a telepsychological visit due to COVID-19. Kelly Bennett is aware it is her responsibility to secure confidentiality on her end of the session. She provided verbal consent to proceed with today's appointment. Prior to proceeding with today's appointment, Kelly Bennett's physical location at the time of this appointment was obtained as well a phone number she could be reached at in the event  of technical difficulties. Kelly Bennett denied anyone else being present in the room or on the virtual appointment.  This provider and Kelly Bennett completed the interactive feedback session which includes reviewing the aforementioned measures, treatment recommendations, and diagnostic conclusions.   The interactive feedback session was completed today and a total of 52 minutes was spent on feedback. Code [phone] was billed for feedback session.   DSM-5 Diagnosis(es):  F90.2 Attention-Deficit/Hyperactivity Disorder, Combined Presentation, Moderate  Time Requirements: Assessment scoring and interpreting: 225 minutes (billing code [phone] and [phone] [6 units]) Feedback: 52 minutes (billing code [phone]) Report writing: 295 total minutes. 03/04/2022: 5:45-6:45pm. 03/07/2021: 9:40-10:45am. 03/20/2022: 10:40-11:05am, 1:40-2:45pm. 03/21/2022: 9:55-11:15am. (billing code [phone] [5 units])  Plan: Kelly Bennett provided verbal consent for her evaluation to be sent via e-mail. No further follow-up planned by this provider.        CONFIDENTIAL PSYCHOLOGICAL EVALUATION ______________________________________________________________________________  Name: Kelly Bennett   Date of Birth: 2001-02-24    Age: 55 Dates of Evaluation: 03/04/2022 and 03/20/2022  SOURCE AND REASON FOR REFERRAL: [name] was referred by Dr. Scarlett Bennett for an evaluation to ascertain if she meets criteria for Attention Deficit/Hyperactivity Disorder (ADHD).   EVALUATIVE PROCEDURES: Clinical Interview with [name] (03/04/2022) Wechsler Adult Intelligence Scale-Fourth Edition (WAIS-IV; 03/20/2022) CNS Vital Signs (03/20/2022) Adult Attention Deficit/Hyperactivity Disorder Self-Report Scale Checklist (03/20/2022) Behavior Rating Inventory for Executive Function - A - Self Report Behavior Rating Inventory for Executive Function - A - Self Report (BRIEF-; 03/20/2022) and Informant (03/20/2022) Personality Assessment Inventory (PAI; 03/20/2022) Patient  Health Questionnaire-9 (PHQ-9) Generalized Anxiety Disorder-7 (GAD-7) Adult OCD Inventory (OCD-A) SF-20 (03/20/2022)   BACKGROUND INFORMATION AND PRESENTING PROBLEM: [name] is a 22 year old female who resides in [place]  Ms. Ketterman reported during childhood she was diagnosed with ADHD by a mental professional after completing a "several page questionnaire" during a one hour long appointment, which  also led to her pediatrician prescribing Vyvanse. She described Vyvanse as improving "concentration," reducing distractibility, and assisting with task initiation and completion, adding how her friend since childhood has commented there is a "night and day difference when [she] is on and off [Vyvanse]." She discussed how trouble "keeping[ing] up with medical appointments" during the COVID-19 pandemic led to her ceasing use of Vyvanse, and that Dr. Shea Bennett recommended she complete an ADHD evaluation prior to potentially prescribing an ADHD-related medication. She shared her ADHD-related concerns include alternating between being easily distracted by various stimuli (e.g., sound, visuals, movement, and thoughts) and hyperfocus on "random projects" that involves dyschronometria and contributes to a negative impact on her ability to complete higher priority tasks, adding after a period of hyperfocus she often abruptly loses interest in the "project;" occasionally missing small details or making careless mistakes when "rushing"; trouble sustaining attention even when others are speaking directly to her and she is trying to do so; organization issues (e.g., clutter in her car) that she attributed to forgetting to take things out as well as the extra effort required to remove items since she lives in a third floor apartment; regular forgetfulness of a variety of things (e.g., appointments, plans, and where she placed items), noting she "rel[ies]" on calendars and reminders; feeling restless and having  urges to leave her seat when she is required to stay seated; planning issues that she described as "taking [her] a little longer" than others to plan; task initiation that she described as her "procrastinate[ing] like no one's business" on a variety of tasks (e.g., putting away Christmas decorations and calling to schedule needed appointments), maintenance (e.g., becoming distracted by other tasks or losing interest in the task), and disengagement problems that she indicated include worries it will be "hard to get back to" the task if she stops it and a desire to "take advantage of the mindset [of task completion]"; habitual fidgeting and squirming she stated is largely unconscious and includes "always [a part of the body] moving" (e.g., tapping her fingers and moving around); engaging in excessive talking, especially if she is "comfortable" with who she is speaking with; losing track of her vocal volume and interrupting others, noting she engages in efforts to not act on urges to interrupt but frequently catches herself interrupting "right after" having done so; tending to drive at least five miles per hour over the speed limit, but that she commonly drives [phone] over when she is "running late;" being prone to starting a task without fully understanding the directions which can lead to frustration, mistakes being made, and an extended time being required to complete the task; trouble doing things in proper order or sequence as she is prone to completing easier tasks first in an effort to avoid frustration that can occur with more complex tasks, although added how this can cause mistakes; and becoming irritable and/or overstimulated if multiple people are trying to talk to her at one time, there is a lot of noise and movement in her environment, or her attention is continuing to be pulled from a task. 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 that included having an "anxiety attack" one-to-two times a day, which she attributed to stress from the "social aspects" of being around "twenty guys." She also reported a history of concerns about germs and touching items others have touched; sleep onset (e.g., going to bed later  than planned due to "losing track of time" while using her phone) and maintenance issues (e.g., being easily awoken by sounds which can lead to a hard time falling back asleep if she "hyperfocuses on the noise" or her "mind wakes up"); mindless eating as a form of stimulation when she is feeling under stimulated or "bored" but not actually hungry; and having experienced an unspecified disturbing event(s) that she noted partially contributed to her prior use of mental health services. She expressed a belief her ADHD-related concerns are independent of mood but can be exacerbated by low mood and stress. She stated her coping strategies include utilizing a calendar, notes, and reminders; having designated places for items; double checking her work; getting up and walking around after having sat for an extended period; being mindful to not start tasks she may have an urge to finish when it is close to times for other plans or priorities; completing "small tasks" to feel productive and reduce feelings of stress; mindfulness-based strategies; using a fan for "white noise" to assist with sleep; and chewing gum to prevent eating snacks when she is not hungry.  [name] denied awareness of ever experiencing any developmental milestone delays or use of an IEP, stating she was notified she was "advanced" on developmental milestones. She shared she was retained in kindergarten per her mother's preference as she was younger than most of her class, noting she is unaware of her teacher having expressed any concerns about her advancing to the first grade. She further shared she often did well  academically and completed AP honors courses in high school. She reported having experienced an in-school suspension secondary to "doing a dare [she] should not have" and that her teachers often commented on report cards she engaged in "excessive talking a lot." She stated she attended college for a "few years" pursuing a "niche degree" that was a "learning curve" as she had to "do a lot of self-learning," adding without having used Vyvanse at that time it would have been even more difficult as she likely would have "procrastinated tasks even more." She further reported having dropped out of college as she would have had to "restart college" if she changed degrees so she pursued "insurance certifications" instead. She denied having a history of employment disciplinary actions.   Ms. Gurr reported her medical history is significant for asthma that she utilizes an inhaler for as needed. She denied awareness of ever experiencing head injury or seizures. She discussed utilizing mental health services for a two year period of high school, noting her mother "had [her] go" to process emotional experiences. She described the services as helpful. She reported use of one-to-six standard size drinks of alcohol one-to-two times a week; "fluctuating" use of vaping nicotine that she described as frequently being "mindless;" and occasional use of a Redbull energy drink and infrequent use of coffee. She denied awareness of ever experiencing psychiatric hospitalization; depressive episode; hypomanic or manic episode; sleep apnea-related concerns; current trauma- and stressor-related symptomatology; homicidal ideation, plan, or intent; or legal involvement. She shared having a familial mental health history is significant for possible ADHD (mother) and possible OCD (father).   Chart Review: On 11/21/2021 Dr. Jomarie Longs noted "[Ms. Amendola] today reports she has been struggling with attention and focus deficit all her life"  that "may have started in elementary school" and includes "episodes of inattention as well as hyperactivity;" being "fidgety" and "restless;" having "trouble sitting and reading," "keeping up with projects," organization, and forgetfulness. Dr.  Eappen also noted she "reports she was on medications like Vyvanse 50 mg and Adderall 5 to 10 mg as needed in high school," "tried medications like Wellbutrin and Lexapro" but "did not like the effect and stopped taking it," and having experienced past anxiety and "trauma (emotional and sexual) that she did not elaborate further on" but "reports it does not bother her now."  BEHAVIORAL OBSERVATIONS: [name] presented on time for the evaluation. She was well-groomed. She was oriented to time, place, person, and purpose of the appointment. During the evaluation Ms. Gotcher verbalized and/or demonstrated doubt (e.g., commonly stating "Maybe" or "I don't know" after providing an answer as well as "I feel like these are horrible answers" and "I am going to have Kelly Bennett; A solid 75."), working memory-related difficulties (e.g., reported she forgot an answer she had provided after this evaluator queued for additional information, stating "Just remembering what you're saying is hard" and "Oh my Kelly Bennett this is hard" during working memory tasks, and using her fingers to assist in tracking and providing a visual component to verbally presented information), thinking- and communication- related issues (e.g., stating "All of a sudden I cannot think of what [Vocabulary subtest word] means" and "I just lost my train of thought" as well as expressing thought processes out loud as she attempted to determine the answer to a question). Throughout the course of the evaluation, she maintained appropriate eye contact. There were no overt signs of perceptual disturbances, nor did she report such symptomatology. There was no evidence of paraphasias (i.e., errors in speech, gross  mispronunciations, and word substitutions), repetition deficits, or disturbances in volume or prosody (i.e., rhythm and intonation). Overall, based on Ms. Vanvorst's approach to testing, the current results are believed to be a fair estimate of her abilities.  PROCEDURAL CONSIDERATIONS:  Psychological testing measures were conducted through a virtual visit with video and audio capabilities, but otherwise in a standard manner.   The Wechsler Adult Intelligence Scale, Fourth Edition (WAIS-IV) was administered via remote telepractice using digital stimulus materials on [employer] Q-global system. The remote testing environment appeared free of distractions, adequate rapport was established with the examinee via video/audio capabilities, and [name] appeared appropriately engaged in the task throughout the session. No significant technological problems or distractions were noted during administration. Modifications to the standardization procedure included: none. The WAIS-IV subtests, or similar tasks, have received initial validation in several samples for remote telepractice and digital format administration, and the results are considered a valid description of Ms. Missey's skills and abilities.  CLINICAL FINDINGS:  COGNITIVE FUNCTIONING  Wechsler Adult Intelligence Scale, Fourth Edition (WAIS-IV):  [name] completed subtests of the WAIS-IV, a full-scale measure of cognitive ability. She completed subtests of the WAIS-IV, a full-scale measure of cognitive ability. The WAIS-IV is comprised of four indices that measure cognitive processes that are components of intellectual ability; however, only subtests from the Verbal Comprehension and Working Memory indices were administered. As a result, Full-Scale-Bennett (FSIQ) and General Ability Index (GAI) were unable to be determined.   WAIS-IV Scale/Subtest Bennett/Scaled Score 95% Confidence Interval Percentile Rank Qualitative Description  Verbal Comprehension (VCI)  105 99-110 63 Average  Similarities 13     Vocabulary 12     Information 8     Working Memory (WMI) 100 93-107 50 Average  Digit Span 7     Arithmetic 13       The Verbal Comprehension Index (VCI) provides a measure of one's ability to receive, comprehend,  and express language. It also measures the ability to retrieve previously learned information and to understand relationships between words and concepts presented orally. Ms. Spellman obtained a VCI scaled score of 105 (63rd percentile) placing her in the average range compared to same-aged peers. Her performance on the subtests comprising this index was diverse. Out of the three subtests, Ms. Pierron demonstrated the strongest performance on the Similarities subtest, which measured her ability to abstract meaningful concepts and relationships from verbally presented material. Her lowest performance was on the Information subtest which is primarily a measure of her fund of general knowledge but may also be influenced by cultural experience, quality of education, and ability to retrieve information from long-term memory.    The Working Memory Index (WMI) provides a measure of one's ability to sustain attention, concentrate, and exert mental control. Ms. Coatney obtained a WMI scaled score of 100 (50th percentile), placing her in the average range compared to same-aged peers. Her WMI score should be interpreted with caution given the large amount of scatter among the [facility] subtests. Ms. Soroka performed much better on the Arithmetic subtest than on the Digit Span subtest, which may suggest a specific strength in numerical calculations versus working memory. Moreover, throughout the evaluation Ms. Kilty demonstrated various working memory-related concerns, and on more complex arithmetic problems she frequently requested a repetition of the question. She also would occasionally provide a correct answer after having experienced a time failure on an Arithmetic task,  which negatively impacted her performance and may be indicative of working memory-related difficulties and/or trouble processing complex information.   ATTENTION AND PROCESSING  CNS Vital Signs: The CNS Vital Signs assessment evaluates the neurocognitive status of an individual and covers a range of mental processes. The results of the CNS Vital Signs testing indicated average neurocognitive processing ability. Her attentional abilities were similar and in the average-above range. Psychomotor speed and motor speed were above. Processing speed was average. Reaction time was low average. Executive function and cognitive flexibility were average. Working memory was above. Visual memory (images) and verbal memory (words) were similar and in average range. The results suggest Kelly Bennett experiences a weakness in reaction time and strengths in psychomotor speed, complex attention, working memory, and motor speed.   Domain  Standard Score Percentile Validity Indicator Guideline  Neurocognitive Index 102 55 Yes Average  Composite Memory 97 42 Yes Average  Verbal Memory 100 50 Yes Average  Visual Memory 96 40 Yes Average  Psychomotor Speed 112 79 Yes Above  Reaction Time 83 13 Yes Low Average  Complex Attention 116 86 Yes Above  Cognitive Flexibility 103 58 Yes Average  Processing Speed  101 53 Yes Average  Executive Function 104 61 Yes Average  Working Memory 112 79 Yes Above  Sustained Attention 108 70 Yes Average  Simple Attention 108 70 Yes Average  Motor Speed 117 87 Yes Above   EXECUTIVE FUNCTION  Behavior Rating Inventory of Executive Function, Second Edition (BRIEF-A) Self-Report:  Ms. Bolick completed the Self-Report Form of the Behavior Rating Inventory of Executive Function-Adult Version (BRIEF-A), which has three domains that evaluate cognitive, behavioral, and emotional regulation, and a Global Executive Composite score provides an overall snapshot of executive functioning. There are no  missing item responses in the protocol.  The Negativity, Infrequency, and Inconsistency scales are not elevated, suggesting she did not respond to the protocol in an overly negative, haphazard, extreme, or inconsistent manner. In the context of these validity considerations, the ratings of Ms. Kirschbaum's  everyday executive function suggest some areas of concern. The overall index, the Global Executive Composite (GEC), was elevated (GEC T = 74, %ile = 96). The Behavioral Regulation Index (BRI) was within normal limits (BRI T = 60, %ile = 76) and the Metacognition Index (MI) was elevated (MI T = 81, %ile = >99). Ms. Rau indicated difficultly with her inhibit impulsive responses, initiate problem solving or activity, sustain working memory, plan and organize problem-solving approaches, and attend to task-oriented output. She did not describe her ability to adjust to changes in routine or task demands, modulate emotions, monitor social behavior, and organize environment and materials as problematic; although adjust to changes in routine or tasks demands, monitor social behavior, and organize environment and materials approached an abnormal elevation.    Scale/Index  Raw Score T Score Percentile  Inhibit 21 80 99  Shift 12 64 92  Emotional Control 10 38 15  Self-Monitor 12 63 92  Behavioral Regulation Index (BRI) 55 60 76  Initiate 20 76 98  Working Memory 23 89 >99  Plan/Organize 25 78 >99  Task Monitor 16 81 >99  Organization of Materials 17 61 85  Metacognition Index (MI) 101 81 >99  Global Executive Composite (GEC) 156 74 96   Validity Scale Raw Score Cumulative Percentile Protocol Classification  Negativity 2 0 - 98.3 Acceptable  Infrequency 0 0 - 97.3 Acceptable  Inconsistency 2 0 - 99.2 Acceptable   Behavior Rating Inventory of Executive Function, Second Edition (BRIEF-A) Informant:  Ms. Kelly Slattery mother, Ms. Kelly Mola, completed the Informant Form of the Behavior Rating Inventory of  Executive Function-Adult Version (BRIEF-A), which is equivalent to the Self-Report version and has three domains that evaluate cognitive, behavioral, and emotional regulation, and a Global Executive Composite score provides an overall snapshot of executive functioning. There are no missing item responses in the protocol.  The Negativity, Infrequency, and Inconsistency scales are not elevated, suggesting she did not respond to the protocol in an overly negative, haphazard, extreme, or inconsistent manner. In the context of these validity considerations, Ms. Kelly Stallsmith's ratings of Ms. Zahari Moccia's everyday executive function suggest some areas of concern. The overall index, the Global Executive Composite (GEC), was within the non-elevated range for age (GEC T = 83, %ile = 84). The Behavioral Regulation Index (BRI) was within normal limits (BRI T = 51, %ile = 62) and the Metacognition Index (MI) was elevated (MI T = 68, %ile = 93). Ms. Jency Schnieders indicated [name] experiences difficultly with her ability to sustain working memory, attend to task-oriented output, and organize environment and materials. Ms. Othell Diluzio did not describe Ms. Iyanla Massmann's ability to inhibit impulsive responses, adjust to changes in routine or task demands, modulate emotions, monitor social behavior, initiate problem solving or activity, and plan and organize problem-solving approaches as problematic; although inhibit impulsive responses, initiate problem solving or activity, and plan and organize problem solving approaches approached an abnormal elevation.     Scale/Index  Raw Score T Score Percentile  Inhibit 15 60 79  Shift 9 49 60  Emotional Control 14 47 50  Self-Monitor 10 51 60  Behavioral Regulation Index (BRI) 48 51 62  Initiate 17 62 90  Working Memory 20 77 99  Plan/Organize 20 60 84  Task Monitor 13 65 93  Organization of Materials 21 68 94  Metacognition Index (MI) 91 68 93  Global Executive  Composite (GEC) 139 61 84   Validity Scale Raw Score Cumulative Percentile Protocol  Classification  Negativity 1 0 - 98.5 Acceptable  Infrequency 0 0 - 93.3 Acceptable  Inconsistency 5 0 - 98.8 Acceptable   BEHAVIORAL FUNCTIONING   Patient Health Questionnaire-9 (PHQ-9): [name] completed the PHQ-9, a self-report measure that assesses symptoms of depression. She scored 9/27, which indicates mild depression.   Generalized Anxiety Disorder-7 (GAD-7): [name] completed the GAD-7, a self-report measure that assesses symptoms of anxiety. She scored 5/21, which indicates mild anxiety.   Adult ADHD Self-Report Scale Symptom Checklist (ASRS): [name] reported the following symptoms as sometimes: misplacing or has difficulty finding things and difficulty relaxing. She endorsed the following symptoms as occurring often: feeling overly active and compelled to do things, making careless mistakes when working on boring or difficult projects, and feeling restless or fidgety. She endorsed the following symptoms as very often: difficulty wrapping up final details of a project following the completion of challenging aspects, difficulty getting things in order when a task requires organization, problems remembering appointments or obligations, avoiding or delaying getting started on tasks requiring a lot of thought, fidgeting or squirming, struggling to sustain attention when doing boring or repetitive work, struggling to concentrate on what people say even when they are speaking directly to her, being distracted by noise around her, leaving her seat when expected to stay seated, talking too much in social situations, interrupting others or finishing their sentences, difficulty waiting for turn in turn taking situations, and interrupting others when they are busy. Endorsement of at least four items in Part A is highly consistent with ADHD in adults. The frequency scores of Part B provides additional cues. [name]  scored a 6/6 on Part A and 10/12 on Part B, which is considered a positive screening for ADHD.   Adult OCD Inventory (OCD-A) SF-20: The OCD-A SF-20 was administered. [name] scored a 60/300, which is indicative of OCD-related concerns not being a problem. She endorsed the following as "Yes, this stops me a little or wastes a little of my time": having trouble making up her mind, arranging things in certain ways or symmetrically, having thoughts or words repeat themselves over and over in her mind, and worrying about the germs that are on things. She endorsed the following as "Yes, this stops me from doing other things or wastes some of my time": worrying about being clean, having to put things away just right, hating dirt or dirty things, and finding herself counting things or having numbers frequently go through her mind. She did not endorse any items as "Yes, this stops me from doing a lot of things and wastes a lot of my time."  Personality Assessment Inventory (PAI): The PAI is an objective inventory of adult personality. The validity indicators suggested Ms. Fales's profile is interpretable (ICN T = 40, INF T = 63, NIM T = 47, and PIM T = 50) and largely free of significant clinical issues or concerns. She endorsed overt physical signs of tension and stress (ANX-P T = 66; e.g., sweaty palms, trembling hands, shortness of breath, or complaints of irregular heartbeats), an activity level that is somewhat higher than normal (MAN-A T = 63), and problems with concentration and decision making (SCZ-T T = 70). She also indicated enjoying novelty and stimulation (ANT-S T = 64), having close and generally supportive relationships with friends and family (NON T = 42), and being overall satisfied with herself and seeing little need for major changes in her behavior (RXR T = 57 and DOM T = 58).  SUMMARY AND CLINICAL IMPRESSIONS: [name] is a 22 year old female who was referred by Dr. Jomarie LongsSaramma Eappen for  an evaluation to determine if she currently meets criteria for a diagnosis of Attention-Deficit/Hyperactivity Disorder (ADHD).   [name] reported she having been previously diagnosed with ADHD as a child and prescribed Vyvanse, but that difficulty "keeping up with medical appointments" during the COVID-19 pandemic led to her ceasing use of Vyvanse. She further reported she is pursuing an ADHD evaluation per Dr. Elvera MariaEappen's recommendation she do so before a potential ADHD-related medication is prescribed. She also described a remote history of suicidal ideation, anxiety, and panic attack-related symptoms; a history of concerns about germs; sleep onset and maintenance issues; mindless eating as a form of stimulation when under stimulated but not actually hungry; and having experienced an unspecified disturbing event(s). She expressed a belief her ADHD-related concerns are independent of mood but can be exacerbated by low mood and stress.   During the evaluation, [name] was administered assessments to measure her current cognitive abilities. Her verbal comprehension abilities were in the average range, and she demonstrated the strongest performance on the Similarities subtest, which required her to abstract meaningful concepts and relationships from verbally presented material. Her lowest performance was on the Information subtest, a measure of a fund of general knowledge that can be influenced by cultural experience, quality of education, and ability to retrieve information from long-term memory. Her ability to sustain attention, concentrate, and exert mental control was also in the average range. She performed much better on the Arithmetic subtest than on the Digit Span subtest, which may suggest a specific strength in numerical calculations versus working memory abilities. This was also suggested by her demonstrating various working memory-related concerns (e.g., regularly requesting repetition of verbally  provided arithmetic problems and stating comments such as "Just remembering what you're saying is hard") and occasionally experiencing a time failure on an Arithmetic task despite having provided a correct answer. Results of the CNS Vital Signs indicated an average neurocognitive processing ability, although she demonstrated a weakness in reaction time. She demonstrated strengths in psychomotor speed, complex attention, working memory, and motor speed.    During the clinical interview and on self-report measures, [name] endorsed executive functioning impairment and attentional dysregulation, hyperactivity- and impulsivity-related symptoms, and meeting full criteria for ADHD. Moreover, her mother, [name], indicated she experiences various executive functioning issues, although at a modestly lower level than [name] perceives herself as experiencing. When considering self-reported symptoms; endorsed and/or demonstrated impairment or weakness on measures of working memory, executive functioning, and reaction time; a potential familial history of ADHD; and a past mental health provider reportedly diagnosing her with ADHD after an evaluation, a diagnosis of F90.2 Attention-Deficit/Hyperactivity Disorder, Combined Presentation, Moderate appears warranted. The specifier of "Moderate" was assigned as she endorsed symptoms in excess of what is needed to make the diagnosis and indicated they negatively impact her academic (e.g., trouble managing academic responsibilities in environments with reduced structure and pressure to complete assignments), social (e.g., often experiences urges to interrupt others and engages in excessive talking), and daily (e.g., regularly forgetful, experiencing task initiation and completion problems, and being easily distracted) functioning.  [name] also endorsed a history of an unspecified disturbing event(s) and suicidal ideation as well as anxiety- and panic attack-related  symptoms, concerns about germs, sleep onset and maintenance issues, and problematic eating. As such, the OCD-A, PHQ-9, GAD-7, and PAI were administered. On the aforementioned measures, she endorsed mild depression-related  symptomatology, although largely denied experiencing significant anxiety- and OCD-related concerns. Given the limited scope of the evaluation, it was unable to be determined if full criteria for a trauma- and stressor-related, anxiety, depressive, sleep-wake, or eating disorder are met of if the symptoms are better explained by ADHD. She would likely benefit from further evaluation of these symptoms to definitively rule them in or out. Should any of the aforementioned be ruled in it would likely be in addition to her diagnosis of ADHD as she indicated her ADHD-related concerns are consistent and independent of mood.   DSM-5 Diagnostic Impressions: F90.2 Attention-Deficit/Hyperactivity Disorder, Combined Presentation, Moderate  RECOMMENDATIONS: Ms. Westfall would likely benefit from making use of strategies for ADHD symptoms:  Setting a timer to complete tasks. Breaking tasks into manageable chunks and spreading them out over longer periods of time with breaks.  Utilizing lists and day calendars to keep track of tasks.  Answering emails daily.  Improving listening skills by asking the speaker to give information in smaller chunks and asking for explanation for clarification as needed. Leaving more than the anticipated time to complete tasks. It may help to keep tasks brief, well within your attention span, and a mix of both high and low interest tasks. Tasks may be gradually increased in length. Practice proactive planning by setting aside time every evening to plan for the next day (e.g., prepare needed materials or pack the car the night before).  Learn how to make an effective and reasonable "to do" list of important tasks and priorities and always keep it easily accessible. Make  additional copies in case it is lost or misplaced. Utilize visual reminders by posting appointments, "to do lists," or schedule in strategic areas at home and at work.  Practice using an appointment book, smart phone or other tech device, or a daily planning calendar, and learn to write down appointments and commitments immediately. Keep notepads or use a portable audio recorder to capture important ideas that would be beneficial to recall at a later time. Learn and practice time management skills. Purchase a programmable alarm watch or set an alarm on smartphone to avoid losing track of time.  Use a color-coded file system, desk and closet organizers, storage boxes, or other organization device to reduce clutter and improve efficiency and structure.  Implement ways to become more aware of your actions and to inhibit or adjust them as warranted (e.g., reviewing videos of your actions, consider consequences of obeying or not obeying the rules of various upcoming situations, have a trusted other to discuss plans with and/or provide cues to stop certain behaviors, and make visual cues for rules you would like to follow). Stay flexible and be prepared to change your plans as symptom breakthroughs and crises are likely to occur periodically. Ms. Montel may benefit from mindfulness training to address symptoms of inattention.  Ms. Amadi would likely benefit from a consultation regarding medication for ADHD symptoms.   Individual therapeutic services may assist in processing a diagnosis of ADHD and discussing coping and compensatory strategies. Mental alertness/energy can be raised by increasing exercise; improving sleep; eating a healthy diet; and managing stress. Consulting with a physician regarding any changes to physical regimen is recommended. "Failing at Normal: An ADHD Success Story" by Calvert Cantor is a great overview of ADHD.  Organizations that are a good source of information on ADHD:  Children  and Adults with Attention-Deficit/Hyperactivity Disorder (CHADD): chadd.org  Attention Deficit Disorder Association (ADDA): HotterNames.de ADD Resources: addresources.org ADD WareHouse: addwarehouse.com  World Federation of ADHD: adhd-federation.org ADDConsults: FightListings.se. Future evaluation if deemed necessary and/or to determine effectiveness of recommended interventions.   Helmut Muster, Psy.D. Licensed Psychologist - [facility] #[id number]             Margarite Gouge, PsyD

## 2022-04-02 ENCOUNTER — Other Ambulatory Visit
Admission: RE | Admit: 2022-04-02 | Discharge: 2022-04-02 | Disposition: A | Discharge status: Home / Self Care | Payer: 59 | Source: Ambulatory Visit | Attending: Psychiatry | Admitting: Psychiatry

## 2022-04-02 ENCOUNTER — Encounter: Payer: Self-pay | Admitting: Psychiatry

## 2022-04-02 ENCOUNTER — Ambulatory Visit
Admission: RE | Admit: 2022-04-02 | Discharge: 2022-04-02 | Disposition: A | Discharge status: Home / Self Care | Payer: 59 | Source: Ambulatory Visit | Attending: Psychiatry | Admitting: Psychiatry

## 2022-04-02 ENCOUNTER — Ambulatory Visit (INDEPENDENT_AMBULATORY_CARE_PROVIDER_SITE_OTHER): Payer: 59 | Admitting: Psychiatry

## 2022-04-02 VITALS — BP 100/67 | HR 70 | Temp 98.7°F | Ht 67.0 in | Wt 208.0 lb

## 2022-04-02 DIAGNOSIS — R41840 Attention and concentration deficit: Secondary | ICD-10-CM

## 2022-04-02 DIAGNOSIS — Z79899 Other long term (current) drug therapy: Secondary | ICD-10-CM | POA: Insufficient documentation

## 2022-04-02 NOTE — Patient Instructions (Signed)
Please call for [phone] 

## 2022-04-02 NOTE — Progress Notes (Signed)
[facility] MD OP Progress Note  04/02/2022 10:58 AM Kelly Bennett  MRN:  [id number]  Chief Complaint:  Chief Complaint  Patient presents with   Follow-up   ADD   Medication Refill   HPI: Kelly Bennett is a 22 year old Caucasian female, employed, single, lives in [place], has a history of attention and concentration deficit was evaluated in office today.  Patient today reports she had psychological testing per Dr. Mallie Mussel and was diagnosed with ADHD.  Pending medical records.  Patient reports she was advised by her provider that it will be faxed over to this office prior to her visit.  Patient reports she continues to struggle with attention and focus problems especially at work.  She currently sits at the desk doing her work from 9 AM to 5 PM and has constant inattention problem, easy distractibility, and it is getting harder and harder to focus throughout the day.  She reports she has tried medications like Adderall extended release and Vyvanse in the past.  She reports she liked the Vyvanse better than the Adderall.  She was on Vyvanse 50 mg in the past.  She would like to start at the lower dosage likely Vyvanse 40 mg at this time and go from there if possible.  Patient reports she currently does not have any significant mood symptoms.  Reports she does have difficulty falling asleep at times however overall sleep has been okay.  Denies any suicidality, homicidality or perceptual disturbances.  Patient denies any other concerns today.  Visit Diagnosis:    ICD-10-CM   1. Attention and concentration deficit  R41.840 EKG 12-Lead    Urine drugs of abuse scrn w alc, routine (Ref Lab)    2. High risk medication use  Z79.899 EKG 12-Lead    Urine drugs of abuse scrn w alc, routine (Ref Lab)      Past Psychiatric History: Reviewed past psychiatric history from progress note on 11/21/2021.  Past trials of medications like Wellbutrin, Lexapro, Adderall, Vyvanse.  Past Medical History:  Past Medical  History:  Diagnosis Date   ADD (attention deficit disorder)    Diagnosed age 17 - probably showed signs of not focusing, short attn span, easily distracted   Anxiety    Asthma    Chronic kidney disease    kidney stones 06/2019- maybe passed them   Frequent headaches    GERD (gastroesophageal reflux disease)    UTI (urinary tract infection)     Past Surgical History:  Procedure Laterality Date   BREAST REDUCTION SURGERY     WISDOM TOOTH EXTRACTION      Family Psychiatric History: Reviewed family psych history from progress note on 11/21/2021.  Family History:  Family History  Problem Relation Age of Onset   Arthritis Mother    Depression Mother    Hypertension Mother    Miscarriages / [place] Mother    Arthritis Father    Cancer Father    Hypertension Father    Arthritis Maternal Grandmother    Hypertension Maternal Grandmother    Alcohol abuse Maternal Grandfather     Social History: Reviewed social history from progress note on 11/21/2021. Social History   Socioeconomic History   Marital status: Single    Spouse name: Not on file   Number of children: Not on file   Years of education: Not on file   Highest education level: Some college, no degree  Occupational History   Occupation: bartender   Occupation: retail   Tobacco Use   Smoking  status: Never   Smokeless tobacco: Never   Tobacco comments:    Vapes and has been for 1.5 years.   Vaping Use   Vaping Use: Every day   Substances: Nicotine  Substance and Sexual Activity   Alcohol use: Yes    Alcohol/week: 2.0 standard drinks of alcohol    Types: 2 Cans of beer per week   Drug use: Not Currently   Sexual activity: Yes    Partners: Male    Birth control/protection: Implant  Other Topics Concern   Not on file  Social History Narrative   Not on file   Social Determinants of Health   Financial Resource Strain: Not on file  Food Insecurity: Not on file  Transportation Needs: Not on file  Physical  Activity: Not on file  Stress: Not on file  Social Connections: Not on file    Allergies: No Known Allergies  Metabolic Disorder Labs: Lab Results  Component Value Date   HGBA1C 5.3 09/27/2019   No results found for: "PROLACTIN" Lab Results  Component Value Date   CHOL 128 09/27/2019   TRIG 56.0 09/27/2019   HDL 60.80 09/27/2019   CHOLHDL 2 09/27/2019   VLDL 11.2 09/27/2019   LDLCALC 56 09/27/2019   Lab Results  Component Value Date   TSH 1.50 09/27/2019    Therapeutic Level Labs: No results found for: "LITHIUM" No results found for: "VALPROATE" No results found for: "CBMZ"  Current Medications: Current Outpatient Medications  Medication Sig Dispense Refill   albuterol (VENTOLIN HFA) 108 (90 Base) MCG/ACT inhaler Inhale into the lungs every 6 (six) hours as needed for wheezing or shortness of breath.     etonogestrel (NEXPLANON) 68 MG IMPL implant Inject into the skin.     meloxicam (MOBIC) 15 MG tablet Take 15 mg by mouth daily. As needed     No current facility-administered medications for this visit.     Musculoskeletal: Strength & Muscle Tone: within normal limits Gait & Station: normal Patient leans: N/A  Psychiatric Specialty Exam: Review of Systems  Psychiatric/Behavioral:  Positive for decreased concentration.   All other systems reviewed and are negative.   Blood pressure 100/67, pulse 70, temperature 98.7 F (37.1 C), height 5\' 7"  (1.702 m), weight 208 lb (94.3 kg), SpO2 96 %.Body mass index is 32.58 kg/m.  General Appearance: Casual  Eye Contact:  Fair  Speech:  Clear and Coherent  Volume:  Normal  Mood:  Euthymic  Affect:  Appropriate  Thought Process:  Goal Directed and Descriptions of Associations: Intact  Orientation:  Full (Time, Place, and Person)  Thought Content: Logical   Suicidal Thoughts:  No  Homicidal Thoughts:  No  Memory:  Immediate;   Fair Recent;   Fair Remote;   Fair  Judgement:  Fair  Insight:  Fair  Psychomotor  Activity:  Normal  Concentration:  Concentration: Good and Attention Span: Fair  Recall:  Good  Fund of Knowledge: Fair  Language: Fair  Akathisia:  No  Handed:  Right  AIMS (if indicated): not done  Assets:  Communication Skills Desire for Improvement Housing Social Support  ADL's:  Intact  Cognition: WNL  Sleep:  Fair   Screenings: GAD-7    Personnel officer Visit from 11/21/2021 in [place] Office Visit from 09/27/2019 in [place] at [facility]  Total GAD-7 Score 5 [place] Visit from 11/21/2021 in [place]  [place] Office Visit from 09/27/2019 in [place] at [facility] Total Score 1 2  PHQ-9 Total Score -- [place] Office Visit from 04/02/2022 in [place] Office Visit from 11/21/2021 in [place] No Risk No Risk        Assessment and Plan: Kelly Bennett is a 22 year old Caucasian female, single, employed, lives in [place], has a history of anxiety, attention and focus deficit, was evaluated in office today.  Patient reports she had neuropsychological testing recently completed, interested in a trial of Vyvanse for her ADHD symptoms.  Plan as noted below.  Plan Attention and concentration deficit-rule out ADHD-unstable Pending medical records, patient has signed an ROI to obtain medical records from Dr. Mallie Mussel.  Patient also has contacted their office regarding faxing records, reported that she was told it will be faxed this morning.  Still pending. Discussed with patient once medical records reviewed and labs and EKG completed we will consider starting Vyvanse 40 mg p.o. daily in the morning.  High risk medication use-will order EKG.  Provided [phone] to schedule . Will  order urine drug screen-patient to go to [facility] lab. Patient advised to consider getting thyroid labs-TSH, patient to discuss with primary care provider.  Follow-up in clinic in 4 weeks or sooner if needed.    This note was generated in part or whole with voice recognition software. Voice recognition is usually quite accurate but there are transcription errors that can and very often do occur. I apologize for any typographical errors that were not detected and corrected.     Ursula Alert, MD 04/03/2022, 8:21 AM

## 2022-04-03 LAB — URINE DRUGS OF ABUSE SCREEN W ALC, ROUTINE (REF LAB)
Amphetamines, Urine: NEGATIVE ng/mL
Barbiturate, Ur: NEGATIVE ng/mL
Benzodiazepine Quant, Ur: NEGATIVE ng/mL
Cannabinoid Quant, Ur: NEGATIVE ng/mL
Cocaine (Metab.): NEGATIVE ng/mL
Ethanol U, Quan: NEGATIVE %
Methadone Screen, Urine: NEGATIVE ng/mL
Opiate Quant, Ur: NEGATIVE ng/mL
Phencyclidine, Ur: NEGATIVE ng/mL
Propoxyphene, Urine: NEGATIVE ng/mL

## 2022-04-06 ENCOUNTER — Telehealth: Payer: Self-pay | Admitting: Psychiatry

## 2022-04-06 DIAGNOSIS — F902 Attention-deficit hyperactivity disorder, combined type: Secondary | ICD-10-CM

## 2022-04-06 MED ORDER — AMPHETAMINE-DEXTROAMPHETAMINE 5 MG PO TABS: 5.0000 mg | ORAL_TABLET | Freq: Every day | ORAL | 0 refills | Status: DC | PRN

## 2022-04-06 MED ORDER — LISDEXAMFETAMINE DIMESYLATE 40 MG PO CAPS: 40.0000 mg | ORAL_CAPSULE | ORAL | 0 refills | Status: DC

## 2022-04-06 NOTE — Telephone Encounter (Signed)
Return call to patient.  Discussed recent EKG as well as urine drug screen-both looks okay. Will start Vyvanse 40 mg daily in the morning and a limited supply of Adderall immediate release 5 mg to take after 4 PM as needed while she is preparing for her exams.  She used to be on this combination previously and it worked.  Medications sent to [facility] per her request.

## 2022-04-15 ENCOUNTER — Other Ambulatory Visit: Payer: Self-pay

## 2022-04-15 ENCOUNTER — Emergency Department
Admission: EM | Admit: 2022-04-15 | Discharge: 2022-04-15 | Disposition: A | Discharge status: Home / Self Care | Payer: 59 | Attending: Emergency Medicine | Admitting: Emergency Medicine

## 2022-04-15 ENCOUNTER — Emergency Department: Payer: 59

## 2022-04-15 DIAGNOSIS — R748 Abnormal levels of other serum enzymes: Secondary | ICD-10-CM | POA: Insufficient documentation

## 2022-04-15 DIAGNOSIS — R7989 Other specified abnormal findings of blood chemistry: Secondary | ICD-10-CM | POA: Insufficient documentation

## 2022-04-15 DIAGNOSIS — K921 Melena: Secondary | ICD-10-CM | POA: Insufficient documentation

## 2022-04-15 DIAGNOSIS — R195 Other fecal abnormalities: Secondary | ICD-10-CM

## 2022-04-15 DIAGNOSIS — R1010 Upper abdominal pain, unspecified: Secondary | ICD-10-CM | POA: Diagnosis present

## 2022-04-15 HISTORY — DX: Calculus of kidney: N20.0

## 2022-04-15 LAB — CBC
HCT: 45.1 % (ref 36.0–46.0)
Hemoglobin: 14.8 g/dL (ref 12.0–15.0)
MCH: 28.9 pg (ref 26.0–34.0)
MCHC: 32.8 g/dL (ref 30.0–36.0)
MCV: 88.1 fL (ref 80.0–100.0)
Platelets: 295 10*3/uL (ref 150–400)
RBC: 5.12 MIL/uL — ABNORMAL HIGH (ref 3.87–5.11)
RDW: 12.1 % (ref 11.5–15.5)
WBC: 5.7 10*3/uL (ref 4.0–10.5)
nRBC: 0 % (ref 0.0–0.2)

## 2022-04-15 LAB — POC URINE PREG, ED: Preg Test, Ur: NEGATIVE

## 2022-04-15 LAB — COMPREHENSIVE METABOLIC PANEL
ALT: 248 U/L — ABNORMAL HIGH (ref 0–44)
AST: 63 U/L — ABNORMAL HIGH (ref 15–41)
Albumin: 4.2 g/dL (ref 3.5–5.0)
Alkaline Phosphatase: 122 U/L (ref 38–126)
Anion gap: 8 (ref 5–15)
BUN: 13 mg/dL (ref 6–20)
CO2: 24 mmol/L (ref 22–32)
Calcium: 9.3 mg/dL (ref 8.9–10.3)
Chloride: 106 mmol/L (ref 98–111)
Creatinine, Ser: 0.6 mg/dL (ref 0.44–1.00)
GFR, Estimated: >60 mL/min (ref >60)
Glucose, Bld: 124 mg/dL — ABNORMAL HIGH (ref 70–99)
Potassium: 4 mmol/L (ref 3.5–5.1)
Sodium: 138 mmol/L (ref 135–145)
Total Bilirubin: 1.3 mg/dL — ABNORMAL HIGH (ref 0.3–1.2)
Total Protein: 7.7 g/dL (ref 6.5–8.1)

## 2022-04-15 LAB — PROTIME-INR
INR: 1.2 (ref 0.8–1.2)
Prothrombin Time: 14.7 seconds (ref 11.4–15.2)

## 2022-04-15 LAB — LIPASE, BLOOD: Lipase: 60 U/L — ABNORMAL HIGH (ref 11–51)

## 2022-04-15 MED ORDER — IOHEXOL 300 MG/ML  SOLN
100.0000 mL | Freq: Once | INTRAMUSCULAR | Status: AC | PRN
  Administered 2022-04-15: 100 mL via INTRAVENOUS

## 2022-04-15 NOTE — ED Triage Notes (Signed)
Pt to ED for black tarry stools since Sunday, also bloating and abdominal discomfort. Denies blood thinners, this has never happened before. Only new med is Vivance. Denies weakness, dizziness. Went to [facility] and they told to come here.

## 2022-04-15 NOTE — ED Provider Notes (Signed)
[facility] Provider Note    Event Date/Time   First MD Initiated Contact with Patient 04/15/22 1035     (approximate)   History   Melena and Abdominal Pain   HPI  Kelly Bennett is a 22 y.o. female  with a history of ADHD who presents with dark stools intermittently for the last 4 days associated with intermittent upper abdominal pain that radiates to the right side and back, nausea, and 1 episode of vomiting.  She has not had any bright blood per rectum.  The patient also has noted dark urine with no frank blood, and some itching in her hands.  She denies any prior history of the symptoms.  The patient reports drinking a few drinks a week but denies any heavy alcohol use.  She was recently started on Vyvanse but denies any frequent NSAID use or other new medications.  I reviewed the past medical records.  The patient's most recent outpatient encounter was with psychiatry on 2/1 for her ADHD.   Physical Exam   Triage Vital Signs: ED Triage Vitals  Enc Vitals Group     BP 04/15/22 1023 (!) 142/90     Pulse Rate 04/15/22 1023 83     Resp 04/15/22 1023 17     Temp 04/15/22 1024 98.4 F (36.9 C)     Temp Source 04/15/22 1023 Oral     SpO2 04/15/22 1023 97 %     Weight 04/15/22 1019 208 lb (94.3 kg)     Height 04/15/22 1019 5' 6"$  (1.676 m)     Head Circumference --      Peak Flow --      Pain Score 04/15/22 1018 4     Pain Loc --      Pain Edu? --      Excl. in [place]? --     Most recent vital signs: Vitals:   04/15/22 1024 04/15/22 1519  BP:  (!) 113/59  Pulse:  63  Resp:  16  Temp: 98.4 F (36.9 C) 98 F (36.7 C)  SpO2:  97%    General: Awake, no distress.  CV:  Good peripheral perfusion.  Resp:  Normal effort.  Abd:  Soft and nontender.  No distention.  Other:  No jaundice or scleral icterus.  Normal external anal exam with no visible fissures or large hemorrhoids.  Dark brown stool, guaiac positive.   ED Results / Procedures /  Treatments   Labs (all labs ordered are listed, but only abnormal results are displayed) Labs Reviewed  COMPREHENSIVE METABOLIC PANEL - Abnormal; Notable for the following components:      Result Value   Glucose, Bld 124 (*)    AST 63 (*)    ALT 248 (*)    Total Bilirubin 1.3 (*)    All other components within normal limits  CBC - Abnormal; Notable for the following components:   RBC 5.12 (*)    All other components within normal limits  LIPASE, BLOOD - Abnormal; Notable for the following components:   Lipase 60 (*)    All other components within normal limits  PROTIME-INR  HEPATITIS PANEL, ACUTE  POC OCCULT BLOOD, ED  POC URINE PREG, ED     EKG     RADIOLOGY  US abdomen RUQ: I independently viewed and interpreted the images; there are no gallstones or wall thickening.  Radiology report indicates no acute abnormalities.  CT abdomen/pelvis: No acute abnormality    PROCEDURES:  Critical  Care performed: No  Procedures   MEDICATIONS ORDERED IN ED: Medications  iohexol (OMNIPAQUE) 300 MG/ML solution 100 mL (100 mLs Intravenous Contrast Given 04/15/22 1329)     IMPRESSION / MDM / ASSESSMENT AND PLAN / ED COURSE  I reviewed the triage vital signs and the nursing notes.  22 year old female with PMH as noted above presents with dark stools, dark urine, abdominal pain, nausea, and itching over the last 4 days.  On exam the abdomen is soft and nontender.  The patient has brown stool which is guaiac positive.  Initial labs reveal mildly elevated lipase, elevated LFTs, minimally elevated bilirubin, and normal hemoglobin.  Differential diagnosis includes, but is not limited to, peptic ulcer disease, gastritis, medications (the patient did take Pepto Bismol few days ago).  Differential for the elevated LFTs and lipase includes acute pancreatitis, gallstones, biliary obstruction, less likely acute hepatitis.  We will obtain a right upper quadrant ultrasound and consider CT if  this is nondiagnostic.  Patient's presentation is most consistent with acute complicated illness / injury requiring diagnostic workup.  ----------------------------------------- 3:36 PM on 04/15/2022 -----------------------------------------  Ultrasound showed no acute findings I proceeded with a CT which is also negative.  I consulted Dr. Marius Ditch from gastroenterology.  She advises that acute hepatitis is the most likely etiology.  The patient has not been taking any Tylenol.  Passed gallstone is also a possibility.  She recommended obtaining an INR and sending off an acute hepatitis panel.  The INR is normal.  At this time, the patient is stable for discharge home.  She has had no significant abdominal pain, nausea, or needed any symptomatic treatment while in the ED.  I counseled the patient on the results of the workup and on GI recommendations.  I gave her strict return precautions and she expressed understanding.  Dr. Marius Ditch advised that the patient may follow-up with primary care first to repeat labs, and then can be referred to GI if needed for any persistent symptoms or abnormal lab results.   FINAL CLINICAL IMPRESSION(S) / ED DIAGNOSES   Final diagnoses:  Elevated LFTs  Dark stools     Rx / DC Orders   ED Discharge Orders     None        Note:  This document was prepared using Dragon voice recognition software and may include unintentional dictation errors.    Arta Silence, MD 04/15/22 1538

## 2022-04-15 NOTE — Discharge Instructions (Addendum)
Your labs showed the following abnormal findings.  Lipase: 60 (pancreatic enzyme) AST: 63 ALT: 248 (liver enzymes) Bilirubin: 1.3  Your hemoglobin (blood count) and other labs are normal.  Your ultrasound and CT did not show any acute findings.  The acute hepatitis panel is still pending.  You should follow-up to have these blood test rechecked in the next week.  If they are persistently abnormal you should follow-up with a gastroenterologist.  Return to the ER for new, worsening, or persistent severe pain, vomiting, fever, jaundice (yellow eyes or yellow skin) blood in the stool, continued or worsening dark tarry stools, or any other new or worsening symptoms that concern you.

## 2022-04-16 LAB — HEPATITIS PANEL, ACUTE
HCV Ab: NONREACTIVE
Hep A IgM: NONREACTIVE
Hep B C IgM: NONREACTIVE
Hepatitis B Surface Ag: NONREACTIVE

## 2022-05-05 ENCOUNTER — Ambulatory Visit (INDEPENDENT_AMBULATORY_CARE_PROVIDER_SITE_OTHER): Payer: 59 | Admitting: Psychiatry

## 2022-05-05 ENCOUNTER — Encounter: Payer: Self-pay | Admitting: Psychiatry

## 2022-05-05 VITALS — BP 124/76 | HR 103 | Temp 97.7°F | Ht 66.0 in | Wt 205.6 lb

## 2022-05-05 DIAGNOSIS — F902 Attention-deficit hyperactivity disorder, combined type: Secondary | ICD-10-CM

## 2022-05-05 MED ORDER — LISDEXAMFETAMINE DIMESYLATE 40 MG PO CAPS: 40.0000 mg | ORAL_CAPSULE | ORAL | 0 refills | Status: DC

## 2022-05-05 MED ORDER — AMPHETAMINE-DEXTROAMPHETAMINE 5 MG PO TABS: 5.0000 mg | ORAL_TABLET | Freq: Every day | ORAL | 0 refills | Status: DC | PRN

## 2022-05-05 NOTE — Progress Notes (Unsigned)
[name]  05/05/2022 9:58 AM Kelly Bennett  MRN:  [id number]  Chief Complaint:  Chief Complaint  Patient presents with   Follow-up   ADD   Medication Refill   HPI: Kelly Bennett is a 22 year old Caucasian female, employed, single, lives in [place], has a history of ADHD, was evaluated in office today.  Patient today reports she is currently compliant on the Vyvanse 40 mg daily.  She also has been using the Adderall 5 mg as needed whenever she has a test.  She reports last month was her most productive month at work.  She reports she is able to focus better and keep up with her tasks at work and at school.  She currently denies any significant side effects.  Patient reports she is sleeping well.  She reports she takes small snacks throughout the day although she does not have her lunch.  She is eating enough for herself.  Denies any significant anxiety, depression symptoms.  Patient with elevated heart rate in session today reports it is likely because she rushed in here.  She does have an apple watch and agrees to keep track of it.   Patient appeared to be alert, oriented to person place time situation.  Patient denies any suicidality, homicidality or perceptual disturbances.  Patient denies any other concerns today.  Visit Diagnosis:    ICD-10-CM   1. Attention deficit hyperactivity disorder (ADHD), combined type  F90.2 lisdexamfetamine (VYVANSE) 40 MG capsule    amphetamine-dextroamphetamine (ADDERALL) 5 MG tablet    lisdexamfetamine (VYVANSE) 40 MG capsule      Past Psychiatric History: Reviewed past psych history from progress note on 11/21/2021.  Past trials of medications like Wellbutrin, Lexapro, Adderall, Vyvanse.  Past Medical History:  Past Medical History:  Diagnosis Date   ADD (attention deficit disorder)    Diagnosed age 49 - probably showed signs of not focusing, short attn span, easily distracted   Anxiety    Asthma    Frequent headaches    GERD  (gastroesophageal reflux disease)    Renal stones    UTI (urinary tract infection)     Past Surgical History:  Procedure Laterality Date   BREAST REDUCTION SURGERY     WISDOM TOOTH EXTRACTION      Family Psychiatric History: Reviewed family psychiatric history from progress note on 11/21/2021.  Family History:  Family History  Problem Relation Age of Onset   Arthritis Mother    Depression Mother    Hypertension Mother    Miscarriages / [place] Mother    Arthritis Father    Cancer Father    Hypertension Father    Arthritis Maternal Grandmother    Hypertension Maternal Grandmother    Alcohol abuse Maternal Grandfather     Social History: Reviewed social history from progress note on 11/21/2021. Social History   Socioeconomic History   Marital status: Single    Spouse name: Not on file   Number of children: Not on file   Years of education: Not on file   Highest education level: Some college, no degree  Occupational History   Occupation: bartender   Occupation: retail   Tobacco Use   Smoking status: Never   Smokeless tobacco: Never   Tobacco comments:    Vapes and has been for 1.5 years.   Vaping Use   Vaping Use: Every day   Substances: Nicotine  Substance and Sexual Activity   Alcohol use: Yes    Alcohol/week: 2.0 standard drinks of  alcohol    Types: 2 Cans of beer per week   Drug use: Not Currently   Sexual activity: Yes    Partners: Male    Birth control/protection: Implant  Other Topics Concern   Not on file  Social History Narrative   Not on file   Social Determinants of Health   Financial Resource Strain: Not on file  Food Insecurity: Not on file  Transportation Needs: Not on file  Physical Activity: Not on file  Stress: Not on file  Social Connections: Not on file    Allergies: No Known Allergies  Metabolic Disorder Labs: Lab Results  Component Value Date   HGBA1C 5.3 09/27/2019   No results found for: "PROLACTIN" Lab Results   Component Value Date   CHOL 128 09/27/2019   TRIG 56.0 09/27/2019   HDL 60.80 09/27/2019   CHOLHDL 2 09/27/2019   VLDL 11.2 09/27/2019   LDLCALC 56 09/27/2019   Lab Results  Component Value Date   TSH 1.50 09/27/2019    Therapeutic Level Labs: No results found for: "LITHIUM" No results found for: "VALPROATE" No results found for: "CBMZ"  Current Medications: Current Outpatient Medications  Medication Sig Dispense Refill   albuterol (VENTOLIN HFA) 108 (90 Base) MCG/ACT inhaler Inhale into the lungs every 6 (six) hours as needed for wheezing or shortness of breath.     etonogestrel (NEXPLANON) 68 MG IMPL implant Inject into the skin.     [START ON 06/06/2022] lisdexamfetamine (VYVANSE) 40 MG capsule Take 1 capsule (40 mg total) by mouth every morning. 30 capsule 0   meloxicam (MOBIC) 15 MG tablet Take 15 mg by mouth daily. As needed     amphetamine-dextroamphetamine (ADDERALL) 5 MG tablet Take 1 tablet (5 mg total) by mouth daily as needed. Take daily as needed at 4 PM 15 tablet 0   [START ON 05/08/2022] lisdexamfetamine (VYVANSE) 40 MG capsule Take 1 capsule (40 mg total) by mouth every morning. 30 capsule 0   No current facility-administered medications for this visit.     Musculoskeletal: Strength & Muscle Tone: within normal limits Gait & Station: normal Patient leans: N/A  Psychiatric Specialty Exam: Review of Systems  Psychiatric/Behavioral: Negative.    All other systems reviewed and are negative.   Blood pressure 124/76, pulse (!) 103, temperature 97.7 F (36.5 C), temperature source Skin, height '5\' 6"'$  (1.676 m), weight 205 lb 9.6 oz (93.3 kg).Body mass index is 33.18 kg/m.  General Appearance: Casual  Eye Contact:  Good  Speech:  Clear and Coherent  Volume:  Normal  Mood:  Euthymic  Affect:  Congruent  Thought Process:  Goal Directed and Descriptions of Associations: Intact  Orientation:  Full (Time, Place, and Person)  Thought Content: Logical   Suicidal  Thoughts:  No  Homicidal Thoughts:  No  Memory:  Immediate;   Fair Recent;   Fair Remote;   Fair  Judgement:  Fair  Insight:  Fair  Psychomotor Activity:  Normal  Concentration:  Concentration: Fair and Attention Span: Fair  Recall:  [employer] of Knowledge: Fair  Language: Fair  Akathisia:  No  Handed:  Right  AIMS (if indicated): not done  Assets:  Communication Skills Desire for [place] Talents/Skills  ADL's:  Intact  Cognition: WNL  Sleep:  Fair   Screenings: GAD-7    Personnel officer Visit from 05/05/2022 in [place] Office Visit from 11/21/2021 in [place] Office Visit  from 09/27/2019 in [place] at [facility]  Total GAD-7 Score '1 5 16      '$ [employer] Office Visit from 05/05/2022 in [place] Office Visit from 11/21/2021 in [place] Office Visit from 09/27/2019 in [place] at [facility] Total Score '1 1 2  '$ PHQ-9 Total Score 2 -- [place] Office Visit from 05/05/2022 in [place] ED from 04/15/2022 in [facility] Emergency Department at [facility] Visit from 04/02/2022 in [place] No Risk No Risk No Risk        Assessment and Plan: Keauna Bolam is a 22 year old Caucasian female, single, employed, lives in [place], has a history of anxiety, attention and focus deficit was evaluated in office today.  Patient is currently improving on the current medication regimen.  Plan as noted below.  Plan ADHD-improving Vyvanse 40 mg p.o. daily Adderall 5 mg mediated release p.o. daily in the afternoon as needed. Reviewed [place] PMP AWARxE  Reviewed urine drug  screen-discussed with patient-04/02/2022-negative. Reviewed EKG and discussed with patient 04/02/2022.  Follow-up in clinic in 2 months or sooner in person.    Consent: Patient/Guardian gives verbal consent for treatment and assignment of benefits for services provided during this visit. Patient/Guardian expressed understanding and agreed to proceed.   This note was generated in part or whole with voice recognition software. Voice recognition is usually quite accurate but there are transcription errors that can and very often do occur. I apologize for any typographical errors that were not detected and corrected.    Ursula Alert, MD 05/06/2022, 9:47 AM

## 2022-06-02 ENCOUNTER — Ambulatory Visit: Payer: 59 | Admitting: Psychiatry

## 2022-06-19 ENCOUNTER — Telehealth: Payer: Self-pay

## 2022-06-19 DIAGNOSIS — F902 Attention-deficit hyperactivity disorder, combined type: Secondary | ICD-10-CM

## 2022-06-19 MED ORDER — AMPHETAMINE-DEXTROAMPHETAMINE 5 MG PO TABS: 5.0000 mg | ORAL_TABLET | Freq: Every day | ORAL | 0 refills | Status: DC | PRN

## 2022-06-19 NOTE — Telephone Encounter (Signed)
I have sent Adderall to pharmacy.  Patient however does have Vyvanse available-lisdexamfetamine (VYVANSE) 40 MG capsule30 capsule04/6/20245/6/2024Sig - Route: Take 1 capsule (40 mg total) by mouth every morning. - OralSent to pharmacy as: lisdexamfetamine (VYVANSE) 40 MG capsuleEarliest Fill Date: 4/6/2024Notes to Pharmacy: Please fill on or after 04/06/2024E-Prescribing Status: Receipt confirmed by pharmacy (05/05/2022  9:56 AM EST)   A prescription as noted above was sent out to be filled on or after 06/06/2022.  Please let patient know.

## 2022-06-19 NOTE — Telephone Encounter (Signed)
Patient called to request a refill for the following medication please advise   amphetamine-dextroamphetamine (ADDERALL) 5 MG tablet  lisdexamfetamine (VYVANSE) 40 MG capsule    Pharmacy [facility] [address], [place] - [address] Phone: [phone]  Fax: [phone]

## 2022-06-22 ENCOUNTER — Telehealth: Payer: Self-pay

## 2022-06-22 NOTE — Telephone Encounter (Signed)
left message to get what medication she needed. left voicemail that if it was for the vyvanse then there should be a rx from 05-05-22 that was suppose to be put on hold for 06-06-22 if she already picked up she should have enough until 5-6. Message advised pt to call pharmacy and see if rx was placed on hold  asked pt to call office back if this was not the medication needed.     Disp Refills Start End   lisdexamfetamine (VYVANSE) 40 MG capsule 30 capsule 0 06/06/2022 07/06/2022   Sig - Route: Take 1 capsule (40 mg total) by mouth every morning. - Oral   Sent to pharmacy as: lisdexamfetamine (VYVANSE) 40 MG capsule   Earliest Fill Date: 06/06/2022   Notes to Pharmacy: Please fill on or after 06/06/2022   E-Prescribing Status: Receipt confirmed by pharmacy (05/05/2022  9:56 AM EST)

## 2022-06-22 NOTE — Telephone Encounter (Signed)
Called to inform patient no answer left voicemail for patient to return call to office 

## 2022-06-22 NOTE — Telephone Encounter (Signed)
pt left message that she needed refills on medication.  Pt was last seen on 05-05-22 next appt 06-29-22.

## 2022-06-29 ENCOUNTER — Encounter: Payer: Self-pay | Admitting: Psychiatry

## 2022-06-29 ENCOUNTER — Ambulatory Visit (INDEPENDENT_AMBULATORY_CARE_PROVIDER_SITE_OTHER): Payer: 59 | Admitting: Psychiatry

## 2022-06-29 VITALS — BP 115/75 | HR 80 | Temp 97.6°F | Ht 66.0 in | Wt 201.2 lb

## 2022-06-29 DIAGNOSIS — F902 Attention-deficit hyperactivity disorder, combined type: Secondary | ICD-10-CM

## 2022-06-29 MED ORDER — AMPHETAMINE-DEXTROAMPHETAMINE 5 MG PO TABS: 5.0000 mg | ORAL_TABLET | Freq: Every day | ORAL | 0 refills | Status: DC | PRN

## 2022-06-29 MED ORDER — LISDEXAMFETAMINE DIMESYLATE 40 MG PO CAPS: 40.0000 mg | ORAL_CAPSULE | ORAL | 0 refills | Status: DC

## 2022-06-29 NOTE — Progress Notes (Unsigned)
BH MD OP Progress Note  06/29/2022 5:30 PM Kelly Bennett  MRN:  [id number]  Chief Complaint:  Chief Complaint  Patient presents with   Follow-up   ADD   Medication Refill   HPI: Kelly Bennett is a 22 year old Caucasian female, employed, single, lives in [place], has a history of ADHD was evaluated in office today.  Patient today reports she is currently doing overall well at work.  She however reports she is currently taking a new course which is self-paced.  She reports she has around 6 months to complete the course.  She however is planning to get it done in 4 to 6 weeks.  She reports she hence has been struggling with attention and focus especially at the end of the day.  The Adderall 5 mg does not seem to help much.  She hence doubled up on the Adderall recently and wonders whether her dosage should be readjusted.  Patient currently noncompliant on the Vyvanse 40 mg and she reports she has difficulty getting it filled since it is on backorder.  Otherwise denies any side effects to the Vyvanse.  Reports sleep is overall okay.  Patient denies any significant anxiety or mood symptoms.  Denies any suicidality, homicidality or perceptual disturbances.  Patient denies any other concerns today.  Visit Diagnosis:    ICD-10-CM   1. Attention deficit hyperactivity disorder (ADHD), combined type  F90.2 amphetamine-dextroamphetamine (ADDERALL) 5 MG tablet    lisdexamfetamine (VYVANSE) 40 MG capsule      Past Psychiatric History: I have reviewed past psychiatric history from progress note on 11/21/2021.  Past Medical History:  Past Medical History:  Diagnosis Date   ADD (attention deficit disorder)    Diagnosed age 55 - probably showed signs of not focusing, short attn span, easily distracted   Anxiety    Asthma    Frequent headaches    GERD (gastroesophageal reflux disease)    Renal stones    UTI (urinary tract infection)     Past Surgical History:  Procedure Laterality Date    BREAST REDUCTION SURGERY     WISDOM TOOTH EXTRACTION      Family Psychiatric History: I have reviewed family psychiatric history from progress note on 11/21/2021.  Family History:  Family History  Problem Relation Age of Onset   Arthritis Mother    Depression Mother    Hypertension Mother    Miscarriages / [place] Mother    Arthritis Father    Cancer Father    Hypertension Father    Arthritis Maternal Grandmother    Hypertension Maternal Grandmother    Alcohol abuse Maternal Grandfather     Social History: Reviewed social history from progress note on 11/21/2021. Social History   Socioeconomic History   Marital status: Single    Spouse name: Not on file   Number of children: Not on file   Years of education: Not on file   Highest education level: Some college, no degree  Occupational History   Occupation: bartender   Occupation: retail   Tobacco Use   Smoking status: Never   Smokeless tobacco: Never   Tobacco comments:    Vapes and has been for 1.5 years.   Vaping Use   Vaping Use: Every day   Substances: Nicotine  Substance and Sexual Activity   Alcohol use: Yes    Alcohol/week: 2.0 standard drinks of alcohol    Types: 2 Cans of beer per week   Drug use: Not Currently   Sexual activity: Yes  Partners: Male    Birth control/protection: Implant  Other Topics Concern   Not on file  Social History Narrative   Not on file   Social Determinants of Health   Financial Resource Strain: Not on file  Food Insecurity: Not on file  Transportation Needs: Not on file  Physical Activity: Not on file  Stress: Not on file  Social Connections: Not on file    Allergies: No Known Allergies  Metabolic Disorder Labs: Lab Results  Component Value Date   HGBA1C 5.3 09/27/2019   No results found for: "PROLACTIN" Lab Results  Component Value Date   CHOL 128 09/27/2019   TRIG 56.0 09/27/2019   HDL 60.80 09/27/2019   CHOLHDL 2 09/27/2019   VLDL 11.2 09/27/2019    LDLCALC 56 09/27/2019   Lab Results  Component Value Date   TSH 1.50 09/27/2019    Therapeutic Level Labs: No results found for: "LITHIUM" No results found for: "VALPROATE" No results found for: "CBMZ"  Current Medications: Current Outpatient Medications  Medication Sig Dispense Refill   albuterol (VENTOLIN HFA) 108 (90 Base) MCG/ACT inhaler Inhale into the lungs every 6 (six) hours as needed for wheezing or shortness of breath.     etonogestrel (NEXPLANON) 68 MG IMPL implant Inject into the skin.     lisdexamfetamine (VYVANSE) 40 MG capsule Take 1 capsule (40 mg total) by mouth every morning. 30 capsule 0   meloxicam (MOBIC) 15 MG tablet Take 15 mg by mouth daily. As needed     [START ON 07/02/2022] amphetamine-dextroamphetamine (ADDERALL) 5 MG tablet Take 1 tablet (5 mg total) by mouth daily as needed. Take daily as needed at 4 PM 30 tablet 0   lisdexamfetamine (VYVANSE) 40 MG capsule Take 1 capsule (40 mg total) by mouth every morning. 30 capsule 0   No current facility-administered medications for this visit.     Musculoskeletal: Strength & Muscle Tone: within normal limits Gait & Station: normal Patient leans: N/A  Psychiatric Specialty Exam: Review of Systems  Psychiatric/Behavioral:  Positive for decreased concentration.   All other systems reviewed and are negative.   Blood pressure 115/75, pulse 80, temperature 97.6 F (36.4 C), temperature source Skin, height 5\' 6"  (1.676 m), weight 201 lb 3.2 oz (91.3 kg).Body mass index is 32.47 kg/m.  General Appearance: Casual  Eye Contact:  Fair  Speech:  Clear and Coherent  Volume:  Normal  Mood:  Euthymic  Affect:  Appropriate  Thought Process:  Goal Directed and Descriptions of Associations: Intact  Orientation:  Full (Time, Place, and Person)  Thought Content: Logical   Suicidal Thoughts:  No  Homicidal Thoughts:  No  Memory:  Immediate;   Fair Recent;   Fair Remote;   Fair  Judgement:  Fair  Insight:  Fair   Psychomotor Activity:  Normal  Concentration:  Concentration: Fair and Attention Span: Fair  Recall:  [employer] of Knowledge: Fair  Language: Good  Akathisia:  No  Handed:  Right  AIMS (if indicated): not done  Assets:  Communication Skills Desire for Improvement Housing Social Support  ADL's:  Intact  Cognition: WNL  Sleep:  Fair   Screenings: GAD-7    Garment/textile technologist Visit from 06/29/2022 in [facility] Office Visit from 05/05/2022 in [facility] Office Visit from 11/21/2021 in [facility] Office Visit from 09/27/2019 in [facility] at [facility]  Total GAD-7 Score 2 1  5 16      PHQ2-9    Flowsheet Row Office Visit from 06/29/2022 in [facility] Office Visit from 05/05/2022 in [facility] Office Visit from 11/21/2021 in [facility] Office Visit from 09/27/2019 in [facility] at [facility] Total Score 0 1 1 2   PHQ-9 Total Score 3 2 -- 16      Flowsheet Row Office Visit from 06/29/2022 in [facility] Office Visit from 05/05/2022 in [facility] ED from 04/15/2022 in [facility] Department at [facility]  C-SSRS RISK CATEGORY No Risk No Risk No Risk        Assessment and Plan: Kelly Bennett is a 22 year old Caucasian female, single, employed, lives in [place] has a history of anxiety, attention and focus deficit was evaluated in office today.  Patient is currently struggling with attention problems at the end of the day, discussed plan as noted below.  ADHD-unstable Discussed reducing the dosage of Vyvanse and increasing the dosage of Adderall. Patient however does not want to make  any changes with the Vyvanse and would like to stay on the Adderall for now. Hence continue Vyvanse 40 mg p.o. daily Adderall 5 mg p.o. daily at the end of the day as needed Patient referred for CBT-advised to work with a therapist, provided resources in the community. Reviewed [place] PMP AWARxE  Follow-up in clinic in 2 months or sooner if needed.  Collaboration of Care: Collaboration of Care: Referral or follow-up with counselor/therapist AEB patient encouraged to establish care with a therapist  Patient/Guardian was advised Release of Information must be obtained prior to any record release in order to collaborate their care with an outside provider. Patient/Guardian was advised if they have not already done so to contact the registration department to sign all necessary forms in order for [place] to release information regarding their care.   Consent: Patient/Guardian gives verbal consent for treatment and assignment of benefits for services provided during this visit. Patient/Guardian expressed understanding and agreed to proceed.   This note was generated in part or whole with voice recognition software. Voice recognition is usually quite accurate but there are transcription errors that can and very often do occur. I apologize for any typographical errors that were not detected and corrected.    Jomarie Longs, MD 06/30/2022, 9:20 AM

## 2022-06-29 NOTE — Patient Instructions (Signed)
  www.openpathcollective.org  www.psychologytoday  [facility] [email or web address] [address], [place], [place] [address]  ~20.2 mi [phone]  [facility] [email or web address] [address], [place], [place] [address]  ~20.9 mi [phone]   [facility] - [phone]  [facility] - [phone]  [facility] - [phone]   [facility] - [phone]  [facility] - [phone]   [email or web address]_[email or web address]_today this clinician can offer telehealth and has a sliding scale option  https://www.[facility].org/ this group also offers sliding scale rates and is based out of [place]  Dr. Keisha Rogers with the [facility] specializes in divorce  [facility] has interns who offer sliding scale rates and some of the full time clinicians do, as well. You complete their contact form on their website and the referrals coordinator will help to get connected to someone   

## 2022-08-07 ENCOUNTER — Other Ambulatory Visit: Payer: Self-pay | Admitting: Psychiatry

## 2022-08-07 DIAGNOSIS — F902 Attention-deficit hyperactivity disorder, combined type: Secondary | ICD-10-CM

## 2022-08-10 MED ORDER — LISDEXAMFETAMINE DIMESYLATE 40 MG PO CAPS: 40.0000 mg | ORAL_CAPSULE | Freq: Every morning | ORAL | 0 refills | Status: DC

## 2022-08-10 NOTE — Telephone Encounter (Signed)
I have sent Vyvanse 40 mg to [facility] pharmacy.

## 2022-08-10 NOTE — Telephone Encounter (Signed)
Called [facility] pharmacy spoke to Huntley Dec she will cancel the Vyvanse 40 mg

## 2022-08-13 ENCOUNTER — Telehealth (INDEPENDENT_AMBULATORY_CARE_PROVIDER_SITE_OTHER): Payer: 59 | Admitting: Psychiatry

## 2022-08-13 ENCOUNTER — Encounter: Payer: Self-pay | Admitting: Psychiatry

## 2022-08-13 DIAGNOSIS — F902 Attention-deficit hyperactivity disorder, combined type: Secondary | ICD-10-CM | POA: Diagnosis not present

## 2022-08-13 MED ORDER — AMPHETAMINE-DEXTROAMPHETAMINE 5 MG PO TABS: 5.0000 mg | ORAL_TABLET | Freq: Every day | ORAL | 0 refills | Status: DC | PRN

## 2022-08-13 NOTE — Progress Notes (Signed)
Virtual Visit via Video Note  I connected with Kelly Bennett on 08/13/22 at  4:40 PM EDT by a video enabled telemedicine application and verified that I am speaking with the correct person using two identifiers.  Location Provider Location : [facility] Patient Location : Work  Participants: Patient , Provider    I discussed the limitations of evaluation and management by telemedicine and the availability of in person appointments. The patient expressed understanding and agreed to proceed.    I discussed the assessment and treatment plan with the patient. The patient was provided an opportunity to ask questions and all were answered. The patient agreed with the plan and demonstrated an understanding of the instructions.   The patient was advised to call back or seek an in-person evaluation if the symptoms worsen or if the condition fails to improve as anticipated.   BH MD OP Progress Note  08/14/2022 8:35 AM Kelly Bennett  MRN:  [id number]  Chief Complaint:  Chief Complaint  Patient presents with   Follow-up   ADD   Medication Refill   HPI: Kelly Bennett is a 22 year old Caucasian female, employed, single, lives in [place], has a history of ADHD was evaluated by telemedicine today.  Patient today reports overall she is currently tolerating the Vyvanse 40 mg.  She reports initially she felt her focus was great on the Vyvanse however recently she has noticed some days when the effect has been wearing off.  She however was not taking the Adderall extra dosage as needed much.  She has started doing a self-paced course and has started taking the 5 mg again as needed.  Patient agrees to monitor and keep log of her current attention problems.  Patient reports overall sleep is good.  She is making sure she eats okay and has been making progress with that.  Patient denies any suicidality, homicidality or perceptual disturbances.  Reports work as going well.  Patient denies any other concerns  today.  Visit Diagnosis:    ICD-10-CM   1. Attention deficit hyperactivity disorder (ADHD), combined type  F90.2 amphetamine-dextroamphetamine (ADDERALL) 5 MG tablet      Past Psychiatric History: Reviewed past psychiatric history from progress note on 11/21/2021.  I have reviewed notes per Dr. Viviano Simas had neuropsychological testing completed, patient meets criteria for ADHD combined type.  Past Medical History:  Past Medical History:  Diagnosis Date   ADD (attention deficit disorder)    Diagnosed age 11 - probably showed signs of not focusing, short attn span, easily distracted   Anxiety    Asthma    Frequent headaches    GERD (gastroesophageal reflux disease)    Renal stones    UTI (urinary tract infection)     Past Surgical History:  Procedure Laterality Date   BREAST REDUCTION SURGERY     WISDOM TOOTH EXTRACTION      Family Psychiatric History: I have reviewed family psychiatric history from progress note on 11/21/2021.  Family History:  Family History  Problem Relation Age of Onset   Arthritis Mother    Depression Mother    Hypertension Mother    Miscarriages / [place] Mother    Arthritis Father    Cancer Father    Hypertension Father    Arthritis Maternal Grandmother    Hypertension Maternal Grandmother    Alcohol abuse Maternal Grandfather     Social History: I have reviewed social history from progress note on 11/21/2021. Social History   Socioeconomic History   Marital status: Single  Spouse name: Not on file   Number of children: Not on file   Years of education: Not on file   Highest education level: Some college, no degree  Occupational History   Occupation: bartender   Occupation: retail   Tobacco Use   Smoking status: Never   Smokeless tobacco: Never   Tobacco comments:    Vapes and has been for 1.5 years.   Vaping Use   Vaping Use: Every day   Substances: Nicotine  Substance and Sexual Activity   Alcohol use: Yes     Alcohol/week: 2.0 standard drinks of alcohol    Types: 2 Cans of beer per week   Drug use: Not Currently   Sexual activity: Yes    Partners: Male    Birth control/protection: Implant  Other Topics Concern   Not on file  Social History Narrative   Not on file   Social Determinants of Health   Financial Resource Strain: Not on file  Food Insecurity: Not on file  Transportation Needs: Not on file  Physical Activity: Not on file  Stress: Not on file  Social Connections: Not on file    Allergies: No Known Allergies  Metabolic Disorder Labs: Lab Results  Component Value Date   HGBA1C 5.3 09/27/2019   No results found for: "PROLACTIN" Lab Results  Component Value Date   CHOL 128 09/27/2019   TRIG 56.0 09/27/2019   HDL 60.80 09/27/2019   CHOLHDL 2 09/27/2019   VLDL 11.2 09/27/2019   LDLCALC 56 09/27/2019   Lab Results  Component Value Date   TSH 1.50 09/27/2019    Therapeutic Level Labs: No results found for: "LITHIUM" No results found for: "VALPROATE" No results found for: "CBMZ"  Current Medications: Current Outpatient Medications  Medication Sig Dispense Refill   albuterol (VENTOLIN HFA) 108 (90 Base) MCG/ACT inhaler Inhale into the lungs every 6 (six) hours as needed for wheezing or shortness of breath.     amphetamine-dextroamphetamine (ADDERALL) 5 MG tablet Take 1 tablet (5 mg total) by mouth daily as needed. Take daily as needed at 4 PM 30 tablet 0   etonogestrel (NEXPLANON) 68 MG IMPL implant Inject into the skin.     lisdexamfetamine (VYVANSE) 40 MG capsule Take 1 capsule (40 mg total) by mouth every morning. 30 capsule 0   lisdexamfetamine (VYVANSE) 40 MG capsule Take 1 capsule (40 mg total) by mouth every morning. 30 capsule 0   meloxicam (MOBIC) 15 MG tablet Take 15 mg by mouth daily. As needed     No current facility-administered medications for this visit.     Musculoskeletal: Strength & Muscle Tone:  UTA Gait & Station:  Seated Patient leans:  N/A  Psychiatric Specialty Exam: Review of Systems  Psychiatric/Behavioral:  Positive for decreased concentration.     There were no vitals taken for this visit.There is no height or weight on file to calculate BMI.  General Appearance: Casual  Eye Contact:  Fair  Speech:  Clear and Coherent  Volume:  Normal  Mood:  Euthymic  Affect:  Congruent  Thought Process:  Goal Directed and Descriptions of Associations: Intact  Orientation:  Full (Time, Place, and Person)  Thought Content: Logical   Suicidal Thoughts:  No  Homicidal Thoughts:  No  Memory:  Immediate;   Fair Recent;   Fair Remote;   Fair  Judgement:  Fair  Insight:  Fair  Psychomotor Activity:  Normal  Concentration:  Concentration: Fair and Attention Span: Fair  Recall:  Fair  Fund of Knowledge: Fair  Language: Fair  Akathisia:  No  Handed:  Right  AIMS (if indicated): not done  Assets:  Communication Skills Desire for Improvement Housing Social Support  ADL's:  Intact  Cognition: WNL  Sleep:  Fair   Screenings: GAD-7    Garment/textile technologist Visit from 06/29/2022 in [facility] Office Visit from 05/05/2022 in [facility] Office Visit from 11/21/2021 in [facility] Office Visit from 09/27/2019 in [facility] at [facility]  Total GAD-7 Score 2 1 5 16       PHQ2-9    Flowsheet Row Office Visit from 06/29/2022 in [facility] Office Visit from 05/05/2022 in [facility] Office Visit from 11/21/2021 in [facility] Office Visit from 09/27/2019 in [facility] Total Score 0 1 1 2   PHQ-9 Total Score 3 2 -- 16      Flowsheet Row Office Visit from 06/29/2022 in [facility]  Office Visit from 05/05/2022 in [facility] ED from 04/15/2022 in [facility] Emergency Department at [facility]  C-SSRS RISK CATEGORY No Risk No Risk No Risk        Assessment and Plan: Kelly Bennett is a 22 year old Caucasian female, single, employed, lives in [place], has a history of anxiety, attention and focus deficit was evaluated by telemedicine today.  Patient currently tolerating Vyvanse and Adderall well.  Discussed plan as noted below.  Plan ADHD-some improvement Continue Vyvanse 40 mg p.o. daily. Adderall 5 mg p.o. daily as needed. Patient to keep track of her symptoms of concentration, keep a log and bring it back to next session. Could either go up on the Vyvanse or consider changing to [facility] PM. Reviewed [place] PMP AWARxE Patient was referred for CBT.  Follow-up in clinic in 6 weeks or sooner in person.     Consent: Patient/Guardian gives verbal consent for treatment and assignment of benefits for services provided during this visit. Patient/Guardian expressed understanding and agreed to proceed.   This note was generated in part or whole with voice recognition software. Voice recognition is usually quite accurate but there are transcription errors that can and very often do occur. I apologize for any typographical errors that were not detected and corrected.    Jomarie Longs, MD 08/14/2022, 8:35 AM

## 2022-08-24 ENCOUNTER — Ambulatory Visit: Payer: 59 | Admitting: Psychiatry

## 2022-09-29 ENCOUNTER — Ambulatory Visit: Payer: Managed Care, Other (non HMO) | Admitting: Psychiatry

## 2022-10-01 ENCOUNTER — Other Ambulatory Visit: Payer: Self-pay | Admitting: Psychiatry

## 2022-10-01 DIAGNOSIS — F902 Attention-deficit hyperactivity disorder, combined type: Secondary | ICD-10-CM

## 2022-10-01 MED ORDER — LISDEXAMFETAMINE DIMESYLATE 40 MG PO CAPS: 40.0000 mg | ORAL_CAPSULE | Freq: Every morning | ORAL | 0 refills | Status: DC

## 2022-10-01 MED ORDER — AMPHETAMINE-DEXTROAMPHETAMINE 5 MG PO TABS: 5.0000 mg | ORAL_TABLET | Freq: Every day | ORAL | 0 refills | Status: DC | PRN

## 2022-10-01 NOTE — Telephone Encounter (Signed)
I have sent Vyvanse 40 mg and Adderall 5 mg to pharmacy at [facility] per request.

## 2022-11-13 ENCOUNTER — Ambulatory Visit (INDEPENDENT_AMBULATORY_CARE_PROVIDER_SITE_OTHER): Payer: 59 | Admitting: Psychiatry

## 2022-11-13 ENCOUNTER — Encounter: Payer: Self-pay | Admitting: Psychiatry

## 2022-11-13 VITALS — BP 124/82 | HR 87 | Temp 98.2°F | Ht 66.0 in | Wt 181.0 lb

## 2022-11-13 DIAGNOSIS — F902 Attention-deficit hyperactivity disorder, combined type: Secondary | ICD-10-CM | POA: Diagnosis not present

## 2022-11-13 MED ORDER — LISDEXAMFETAMINE DIMESYLATE 40 MG PO CAPS: 40.0000 mg | ORAL_CAPSULE | ORAL | 0 refills | Status: DC

## 2022-11-13 MED ORDER — LISDEXAMFETAMINE DIMESYLATE 40 MG PO CAPS: 40.0000 mg | ORAL_CAPSULE | Freq: Every morning | ORAL | 0 refills | Status: DC

## 2022-11-13 NOTE — Progress Notes (Signed)
BH MD OP Progress Note  11/13/2022 10:17 AM Kelly Bennett  MRN:  [id number]  Chief Complaint:  Chief Complaint  Patient presents with   Follow-up   ADD   Medication Refill   HPI: Kelly Bennett is a 22 year old Caucasian female, employed, single, lives in [place], has a history of ADHD was evaluated in office today.  Patient today reports she is currently stable on the Vyvanse 40 mg.  She is able to focus at work.  She is able to keep up with her tasks.  She is also taking some training, self-paced courses, and the Vyvanse has been helpful with that.  She takes the Adderall extra dosage 3-4 times a week only.  Has been limiting use.  Patient reports she is currently staying active.  She is eating healthier.  She has cut back on alcohol use.  She has lost around 20 pounds in the past several months.  Reports she is eating enough for herself.  Patient denies any sleep problems.  Denies any suicidality, homicidality or perceptual disturbances.  Patient denies any other concerns today.    Visit Diagnosis:    ICD-10-CM   1. Attention deficit hyperactivity disorder (ADHD), combined type  F90.2 lisdexamfetamine (VYVANSE) 40 MG capsule    lisdexamfetamine (VYVANSE) 40 MG capsule    lisdexamfetamine (VYVANSE) 40 MG capsule      Past Psychiatric History: I have reviewed past psychiatric history from progress note on 11/21/2021.  I have reviewed notes per Dr. Viviano Simas had neuropsychological testing completed, patient meets criteria for ADHD combined type.  Past Medical History:  Past Medical History:  Diagnosis Date   ADD (attention deficit disorder)    Diagnosed age 31 - probably showed signs of not focusing, short attn span, easily distracted   Anxiety    Asthma    Frequent headaches    GERD (gastroesophageal reflux disease)    Renal stones    UTI (urinary tract infection)     Past Surgical History:  Procedure Laterality Date   BREAST REDUCTION SURGERY     WISDOM TOOTH  EXTRACTION      Family Psychiatric History: Reviewed family psychiatric history from progress note on 11/21/2021.  Family History:  Family History  Problem Relation Age of Onset   Arthritis Mother    Depression Mother    Hypertension Mother    Miscarriages / [place] Mother    Arthritis Father    Cancer Father    Hypertension Father    Arthritis Maternal Grandmother    Hypertension Maternal Grandmother    Alcohol abuse Maternal Grandfather     Social History: Reviewed social history from progress note on 11/21/2021. Social History   Socioeconomic History   Marital status: Single    Spouse name: Not on file   Number of children: Not on file   Years of education: Not on file   Highest education level: Some college, no degree  Occupational History   Occupation: bartender   Occupation: retail   Tobacco Use   Smoking status: Never   Smokeless tobacco: Never   Tobacco comments:    Vapes and has been for 1.5 years.   Vaping Use   Vaping status: Every Day   Substances: Nicotine  Substance and Sexual Activity   Alcohol use: Yes    Alcohol/week: 2.0 standard drinks of alcohol    Types: 2 Cans of beer per week   Drug use: Not Currently   Sexual activity: Yes    Partners: Male  Birth control/protection: Implant  Other Topics Concern   Not on file  Social History Narrative   Not on file   Social Determinants of Health   Financial Resource Strain: Low Risk  (08/26/2018)   [facility], [facility]   Overall Financial Resource Strain (CARDIA)    Difficulty of Paying Living Expenses: Not hard at all  Food Insecurity: No Food Insecurity (08/26/2018)   [facility] from [facility], [facility]   Hunger Vital Sign    Worried About Running Out of Food in the Last Year: Never true    Ran Out of Food in the Last Year: Never true  Transportation Needs: No Transportation Needs (08/26/2018)    Received from [facility], [facility]   [facility] - Transportation    In the past 12 months, has lack of transportation kept you from medical appointments or from getting medications?: No    Lack of Transportation (Non-Medical): No  Physical Activity: Sufficiently Active (06/26/2020)   Received from [facility], [facility]   Exercise Vital Sign    Days of Exercise per Week: 4 days    Minutes of Exercise per Session: 90 min  Stress: No Stress Concern Present (08/26/2018)   Received from [facility], [facility]   [employer] of Occupational Health - Occupational Stress Questionnaire    Feeling of Stress : Only a little  Social Connections: Moderately Integrated (08/26/2018)   Received from [facility], [facility]   Social Connection and Isolation Panel [NHANES]    Frequency of Communication with Friends and Family: More than three times a week    Frequency of Social Gatherings with Friends and Family: More than three times a week    Attends Religious Services: More than 4 times per year    Active Member of Clubs or Organizations: Yes    Attends Engineer, structural: More than 4 times per year    Marital Status: Never married    Allergies: No Known Allergies  Metabolic Disorder Labs: Lab Results  Component Value Date   HGBA1C 5.3 09/27/2019   No results found for: "PROLACTIN" Lab Results  Component Value Date   CHOL 128 09/27/2019   TRIG 56.0 09/27/2019   HDL 60.80 09/27/2019   CHOLHDL 2 09/27/2019   VLDL 11.2 09/27/2019   LDLCALC 56 09/27/2019   Lab Results  Component Value Date   TSH 1.50 09/27/2019    Therapeutic Level Labs: No results found for: "LITHIUM" No results found for: "VALPROATE" No results found for: "CBMZ"  Current Medications: Current Outpatient Medications  Medication Sig Dispense Refill    albuterol (VENTOLIN HFA) 108 (90 Base) MCG/ACT inhaler Inhale into the lungs every 6 (six) hours as needed for wheezing or shortness of breath.     etonogestrel (NEXPLANON) 68 MG IMPL implant Inject into the skin.     [START ON 01/10/2023] lisdexamfetamine (VYVANSE) 40 MG capsule Take 1 capsule (40 mg total) by mouth every morning. 30 capsule 0   meloxicam (MOBIC) 15 MG tablet Take 15 mg by mouth daily. As needed     amphetamine-dextroamphetamine (ADDERALL) 5 MG tablet Take 1 tablet (5 mg total) by mouth daily as needed. Take daily as needed at 4 PM 30 tablet 0   lisdexamfetamine (VYVANSE) 40 MG capsule Take 1 capsule (40 mg total) by mouth every morning. 30 capsule 0   [  START ON 12/12/2022] lisdexamfetamine (VYVANSE) 40 MG capsule Take 1 capsule (40 mg total) by mouth every morning. 30 capsule 0   No current facility-administered medications for this visit.     Musculoskeletal: Strength & Muscle Tone: within normal limits Gait & Station: normal Patient leans: N/A  Psychiatric Specialty Exam: Review of Systems  Psychiatric/Behavioral: Negative.      Blood pressure 124/82, pulse 87, temperature 98.2 F (36.8 C), temperature source Skin, height 5\' 6"  (1.676 m), weight 181 lb (82.1 kg).Body mass index is 29.21 kg/m.  General Appearance: Fairly Groomed  Eye Contact:  Good  Speech:  Clear and Coherent  Volume:  Normal  Mood:  Euthymic  Affect:  Congruent  Thought Process:  Goal Directed and Descriptions of Associations: Intact  Orientation:  Full (Time, Place, and Person)  Thought Content: Logical   Suicidal Thoughts:  No  Homicidal Thoughts:  No  Memory:  Immediate;   Fair Recent;   Fair Remote;   Fair  Judgement:  Fair  Insight:  Fair  Psychomotor Activity:  Normal  Concentration:  Concentration: Fair and Attention Span: Fair  Recall:  [employer] of Knowledge: Fair  Language: Fair  Akathisia:  No  Handed:  Right  AIMS (if indicated): not done  Assets:  Communication  Skills Desire for Improvement Housing Social Support  ADL's:  Intact  Cognition: WNL  Sleep:  Fair   Screenings: GAD-7    Garment/textile technologist Visit from 11/13/2022 in [facility] Office Visit from 06/29/2022 in [facility] Office Visit from 05/05/2022 in [facility] Office Visit from 11/21/2021 in [facility] Office Visit from 09/27/2019 in [facility] [employer]  Total GAD-7 Score 1 2 1 5 16       PHQ2-9    Flowsheet Row Office Visit from 11/13/2022 in [facility] Office Visit from 06/29/2022 in [facility] Office Visit from 05/05/2022 in [facility] Office Visit from 11/21/2021 in [facility] Office Visit from 09/27/2019 in [facility] Total Score 0 0 1 1 2   PHQ-9 Total Score -- 3 2 -- 16      Flowsheet Row Office Visit from 11/13/2022 in [facility] Office Visit from 06/29/2022 in [facility] Office Visit from 05/05/2022 in [facility]  C-SSRS RISK CATEGORY No Risk No Risk No Risk        Assessment and Plan: Kelly Bennett is a 22 year old Caucasian female, single, employed, lives in [place], has a history of ADHD, currently stable on medications.  Plan ADHD-stable Vyvanse 40 mg p.o. daily Adderall 5 mg p.o. daily as needed Reviewed [place] PMP AWARxE   Patient will benefit from repeating TSH labs-patient agrees to follow up with primary care provider.   Consent: Patient/Guardian gives verbal consent for treatment and assignment of benefits for services provided during this  visit. Patient/Guardian expressed understanding and agreed to proceed.   Follow-up in clinic in 3 to 4 months or sooner if needed.  This note was generated in part or whole with voice recognition software. Voice recognition is usually quite accurate but there are transcription errors that can and very often do occur. I apologize for any typographical errors that were not detected and corrected.  Jomarie Longs, MD 11/13/2022, 10:17 AM

## 2023-01-05 ENCOUNTER — Other Ambulatory Visit: Payer: Self-pay | Admitting: Psychiatry

## 2023-01-05 DIAGNOSIS — F902 Attention-deficit hyperactivity disorder, combined type: Secondary | ICD-10-CM

## 2023-01-05 MED ORDER — LISDEXAMFETAMINE DIMESYLATE 40 MG PO CAPS: 40.0000 mg | ORAL_CAPSULE | ORAL | 0 refills | Status: DC

## 2023-01-05 NOTE — Telephone Encounter (Signed)
I have sent Vyvanse to pharmacy. 

## 2023-03-02 ENCOUNTER — Other Ambulatory Visit: Payer: Self-pay | Admitting: Psychiatry

## 2023-03-02 DIAGNOSIS — F902 Attention-deficit hyperactivity disorder, combined type: Secondary | ICD-10-CM

## 2023-03-02 MED ORDER — LISDEXAMFETAMINE DIMESYLATE 40 MG PO CAPS: 40.0000 mg | ORAL_CAPSULE | ORAL | 0 refills | Status: DC

## 2023-03-02 NOTE — Telephone Encounter (Signed)
 I have sent Vyvanse 40 mg to pharmacy as requested.

## 2023-03-09 ENCOUNTER — Telehealth (INDEPENDENT_AMBULATORY_CARE_PROVIDER_SITE_OTHER): Payer: 59 | Admitting: Psychiatry

## 2023-03-09 ENCOUNTER — Encounter: Payer: Self-pay | Admitting: Psychiatry

## 2023-03-09 DIAGNOSIS — F902 Attention-deficit hyperactivity disorder, combined type: Secondary | ICD-10-CM | POA: Diagnosis not present

## 2023-03-09 MED ORDER — AMPHETAMINE-DEXTROAMPHETAMINE 5 MG PO TABS: 5.0000 mg | ORAL_TABLET | Freq: Every day | ORAL | 0 refills | Status: DC | PRN

## 2023-03-09 MED ORDER — LISDEXAMFETAMINE DIMESYLATE 40 MG PO CAPS: 40.0000 mg | ORAL_CAPSULE | Freq: Every morning | ORAL | 0 refills | Status: DC

## 2023-03-09 NOTE — Progress Notes (Signed)
 Virtual Visit via Video Note  I connected with Keven Amour on 03/09/23 at 10:00 AM EST by a video enabled telemedicine application and verified that I am speaking with the correct person using two identifiers.  Location Provider Location : [facility] Patient Location : Work  Participants: Patient , Provider   I discussed the limitations of evaluation and management by telemedicine and the availability of in person appointments. The patient expressed understanding and agreed to proceed.    I discussed the assessment and treatment plan with the patient. The patient was provided an opportunity to ask questions and all were answered. The patient agreed with the plan and demonstrated an understanding of the instructions.   The patient was advised to call back or seek an in-person evaluation if the symptoms worsen or if the condition fails to improve as anticipated.   BH MD OP Progress Note  03/10/2023 9:38 AM Kenli Waldo  MRN:  [id number]  Chief Complaint:  Chief Complaint  Patient presents with   Follow-up   ADD   Medication Refill   HPI: Kelly Bennett is a 23 year old Caucasian female, employed, single, lives in [place], has a history of ADHD was evaluated by telemedicine today.  The patient, with a known history of ADHD, has been on a stable regimen of Vyvanse  40mg  and Adderall 5mg  as needed. They report overall good control of their ADHD symptoms with this regimen. However, they have recently noticed a recurring mild headache around 4pm daily. The patient is unsure if this is related to their medication or other factors such as diet or hydration. They have also noted a decrease in their usual water intake, which could potentially contribute to these headaches.  The patient has not been taking the as-needed Adderall recently, but anticipates needing it again in the next 30 days due to upcoming work-related testing. They have previously used this medication effectively for similar  situations without significant side effects or impact on sleep.  The patient has not started any new medications and denies any symptoms of depression, significant anxiety, or suicidal thoughts.    The patient moved to a new home in October and has been adjusting well to this change.  Patient currently denies any suicidality, homicidality or perceptual disturbances.     Visit Diagnosis:    ICD-10-CM   1. Attention deficit hyperactivity disorder (ADHD), combined type  F90.2 lisdexamfetamine (VYVANSE ) 40 MG capsule    amphetamine -dextroamphetamine  (ADDERALL) 5 MG tablet      Past Psychiatric History: I have reviewed past psychiatric history from progress note on 11/21/2021.  I have reviewed notes per Dr. Sharron 03/20/22 - patient had neuropsychological testing completed-meets criteria for ADHD combined type.  Past Medical History:  Past Medical History:  Diagnosis Date   ADD (attention deficit disorder)    Diagnosed age 33 - probably showed signs of not focusing, short attn span, easily distracted   Anxiety    Asthma    Frequent headaches    GERD (gastroesophageal reflux disease)    Renal stones    UTI (urinary tract infection)     Past Surgical History:  Procedure Laterality Date   BREAST REDUCTION SURGERY     WISDOM TOOTH EXTRACTION      Family Psychiatric History: I have reviewed family psychiatric history from progress note on 11/21/2021.  Family History:  Family History  Problem Relation Age of Onset   Arthritis Mother    Depression Mother    Hypertension Mother    Miscarriages / Stillbirths Mother  Arthritis Father    Cancer Father    Hypertension Father    Arthritis Maternal Grandmother    Hypertension Maternal Grandmother    Alcohol abuse Maternal Grandfather     Social History: I have reviewed social history from progress note on 11/21/2021. Social History   Socioeconomic History   Marital status: Single    Spouse name: Not on file   Number of  children: Not on file   Years of education: Not on file   Highest education level: Some college, no degree  Occupational History   Occupation: bartender   Occupation: retail   Tobacco Use   Smoking status: Never   Smokeless tobacco: Never   Tobacco comments:    Vapes and has been for 1.5 years.   Vaping Use   Vaping status: Every Day   Substances: Nicotine  Substance and Sexual Activity   Alcohol use: Yes    Alcohol/week: 2.0 standard drinks of alcohol    Types: 2 Cans of beer per week   Drug use: Not Currently   Sexual activity: Yes    Partners: Male    Birth control/protection: Implant  Other Topics Concern   Not on file  Social History Narrative   Not on file   Social Drivers of Health   Financial Resource Strain: Low Risk  (08/26/2018)   Received [facility], [facility]   Overall Financial Resource Strain (CARDIA)    Difficulty of Paying Living Expenses: Not hard at all  Food Insecurity: No Food Insecurity (08/26/2018)   Received from [facility], [facility]   Hunger Vital Sign    Worried About Running Out of Food in the Last Year: Never true    Ran Out of Food in the Last Year: Never true  Transportation Needs: No Transportation Needs (08/26/2018)   Received from [facility], [facility]   [facility] - Transportation    In the past 12 months, has lack of transportation kept you from medical appointments or from getting medications?: No    Lack of Transportation (Non-Medical): No  Physical Activity: Sufficiently Active (06/26/2020)   Received from [facility], [facility]   Exercise Vital Sign    Days of Exercise per Week: 4 days    Minutes of Exercise per Session: 90 min  Stress: No Stress Concern Present (08/26/2018)   Received from [facility], [facility]   [employer] of  Occupational Health - Occupational Stress Questionnaire    Feeling of Stress : Only a little  Social Connections: Moderately Integrated (08/26/2018)   Received from [facility], [facility]   Social Connection and Isolation Panel [NHANES]    Frequency of Communication with Friends and Family: More than three times a week    Frequency of Social Gatherings with Friends and Family: More than three times a week    Attends Religious Services: More than 4 times per year    Active Member of Clubs or Organizations: Yes    Attends Banker Meetings: More than 4 times per year    Marital Status: Never married    Allergies: No Known Allergies  Metabolic Disorder Labs: Lab Results  Component Value Date   HGBA1C 5.3 09/27/2019   No results found for: PROLACTIN Lab Results  Component Value Date   CHOL 128 09/27/2019   TRIG 56.0 09/27/2019   HDL  60.80 09/27/2019   CHOLHDL 2 09/27/2019   VLDL 11.2 09/27/2019   LDLCALC 56 09/27/2019   Lab Results  Component Value Date   TSH 1.50 09/27/2019    Therapeutic Level Labs: No results found for: LITHIUM No results found for: VALPROATE No results found for: CBMZ  Current Medications: Current Outpatient Medications  Medication Sig Dispense Refill   lisdexamfetamine (VYVANSE ) 40 MG capsule Take 1 capsule (40 mg total) by mouth every morning. 30 capsule 0   albuterol (VENTOLIN HFA) 108 (90 Base) MCG/ACT inhaler Inhale into the lungs every 6 (six) hours as needed for wheezing or shortness of breath.     amphetamine -dextroamphetamine  (ADDERALL) 5 MG tablet Take 1 tablet (5 mg total) by mouth daily as needed. Take daily as needed at 4 PM 30 tablet 0   etonogestrel (NEXPLANON) 68 MG IMPL implant Inject into the skin.     lisdexamfetamine (VYVANSE ) 40 MG capsule Take 1 capsule (40 mg total) by mouth every morning. 30 capsule 0   [START ON 03/31/2023] lisdexamfetamine (VYVANSE ) 40 MG capsule Take 1  capsule (40 mg total) by mouth every morning. 30 capsule 0   meloxicam  (MOBIC ) 15 MG tablet Take 15 mg by mouth daily. As needed     No current facility-administered medications for this visit.     Musculoskeletal: Strength & Muscle Tone:  UTA Gait & Station:  Seated Patient leans: N/A  Psychiatric Specialty Exam: Review of Systems  Psychiatric/Behavioral: Negative.      There were no vitals taken for this visit.There is no height or weight on file to calculate BMI.  General Appearance: Casual  Eye Contact:  Fair  Speech:  Clear and Coherent  Volume:  Normal  Mood:  Euthymic  Affect:  Congruent  Thought Process:  Goal Directed and Descriptions of Associations: Intact  Orientation:  Full (Time, Place, and Person)  Thought Content: Logical   Suicidal Thoughts:  No  Homicidal Thoughts:  No  Memory:  Immediate;   Fair Recent;   Fair Remote;   Fair  Judgement:  Fair  Insight:  Fair  Psychomotor Activity:  Normal  Concentration:  Concentration: Fair and Attention Span: Fair  Recall:  [employer] of Knowledge: Fair  Language: Fair  Akathisia:  No  Handed:  Right  AIMS (if indicated): not done  Assets:  Desire for Improvement Housing Intimacy Talents/Skills Transportation  ADL's:  Intact  Cognition: WNL  Sleep:  Fair   Screenings: GAD-7    Garment/textile Technologist Visit from 11/13/2022 in [facility] Office Visit from 06/29/2022 in [facility] Office Visit from 05/05/2022 in [facility] Office Visit from 11/21/2021 in [facility] Office Visit from 09/27/2019 in [facility] [employer]  Total GAD-7 Score 1 2 1 5 16       PHQ2-9    Flowsheet Row Office Visit from 11/13/2022 in [facility] Office Visit from 06/29/2022 in [facility] Office Visit from 05/05/2022 in [facility] Office Visit from 11/21/2021 in [facility] Office Visit from 09/27/2019 in [facility] Total Score 0 0 1 1 2   PHQ-9 Total Score -- 3 2 -- 16      Flowsheet Row Video Visit from 03/09/2023 in [facility] Office Visit from  11/13/2022 in [facility] Office Visit from 06/29/2022 in [facility]  C-SSRS RISK CATEGORY No Risk No Risk No Risk        Assessment and Plan: Kelly Bennett is a 23 year old Caucasian female, single, employed, lives in [place], has a history of ADHD, currently stable on current medication regimen, discussed assessment and plan as noted below.  Attention-Deficit/Hyperactivity Disorder (ADHD)-stable ADHD, managed with Vyvanse  40 mg daily and Adderall 5 mg as needed. Reports good symptom control with current regimen. Mild headaches around 4 PM, possibly due to dehydration or dietary changes. No significant side effects or impact on sleep when Adderall is taken around 5 PM. Discussed revisiting Adderall usage during upcoming testing period, with anticipated use 3-4 times per week. - Send prescription for Adderall 5 mg as needed to [facility] on [address] in [place]. - Send prescription for Vyvanse  with a date specified for the end of January.(Reviewed [place] PMP AWARxE) - Schedule follow-up visit in three months for April 9th at 4:40 PM.  General Health Maintenance Generally in good health. Discussed potential causes of mild headaches, including dehydration and dietary changes. - Advise to keep a log of diet and hydration status to identify potential headache triggers.  Follow-up in clinic in 3 months or sooner in person.  Consent: Patient/Guardian gives verbal  consent for treatment and assignment of benefits for services provided during this visit. Patient/Guardian expressed understanding and agreed to proceed.   This note was generated in part or whole with voice recognition software. Voice recognition is usually quite accurate but there are transcription errors that can and very often do occur. I apologize for any typographical errors that were not detected and corrected.    Elliett Guarisco, MD 03/10/2023, 9:38 AM

## 2023-04-20 ENCOUNTER — Other Ambulatory Visit: Payer: Self-pay | Admitting: Psychiatry

## 2023-04-20 DIAGNOSIS — F902 Attention-deficit hyperactivity disorder, combined type: Secondary | ICD-10-CM

## 2023-04-20 MED ORDER — LISDEXAMFETAMINE DIMESYLATE 40 MG PO CAPS: 40.0000 mg | ORAL_CAPSULE | Freq: Every morning | ORAL | 0 refills | Status: DC

## 2023-04-20 NOTE — Telephone Encounter (Signed)
 I have sent Vyvanse to pharmacy as requested.

## 2023-06-03 ENCOUNTER — Other Ambulatory Visit: Payer: Self-pay | Admitting: Psychiatry

## 2023-06-03 DIAGNOSIS — F902 Attention-deficit hyperactivity disorder, combined type: Secondary | ICD-10-CM

## 2023-06-03 MED ORDER — LISDEXAMFETAMINE DIMESYLATE 40 MG PO CAPS: 40.0000 mg | ORAL_CAPSULE | Freq: Every morning | ORAL | 0 refills | Status: DC

## 2023-06-03 MED ORDER — AMPHETAMINE-DEXTROAMPHETAMINE 5 MG PO TABS: 5.0000 mg | ORAL_TABLET | Freq: Every day | ORAL | 0 refills | Status: DC | PRN

## 2023-06-03 NOTE — Telephone Encounter (Signed)
 I have sent Vyvanse and Adderall to pharmacy

## 2023-06-09 ENCOUNTER — Ambulatory Visit (INDEPENDENT_AMBULATORY_CARE_PROVIDER_SITE_OTHER): Payer: Self-pay | Admitting: Psychiatry

## 2023-06-09 ENCOUNTER — Encounter: Payer: Self-pay | Admitting: Psychiatry

## 2023-06-09 VITALS — BP 118/82 | HR 101 | Temp 98.0°F | Ht 66.0 in | Wt 177.2 lb

## 2023-06-09 DIAGNOSIS — F902 Attention-deficit hyperactivity disorder, combined type: Secondary | ICD-10-CM

## 2023-06-09 MED ORDER — LISDEXAMFETAMINE DIMESYLATE 50 MG PO CAPS: 50.0000 mg | ORAL_CAPSULE | Freq: Every morning | ORAL | 0 refills | Status: DC

## 2023-06-09 NOTE — Progress Notes (Unsigned)
 BH MD OP Progress Note  06/09/2023 5:06 PM Kelly Bennett  MRN:  [id number]  Chief Complaint:  Chief Complaint  Patient presents with   Follow-up   ADD   Medication Refill   Discussed the use of AI scribe software for clinical note transcription with the patient, who gave verbal consent to proceed.  History of Present Illness Kelly Bennett is a 23 year old Caucasian female, employed, engaged, lives in [place], has a history of  ADHD who presents for medication management.  Her current medication regimen includes Vyvanse 40 mg taken Monday through Friday and Adderall 5 mg as needed, primarily on weekends. She notes variability in the effectiveness of her medication, with some days being more focused than others. She experiences slightly more good days than bad days, attributing this to her general attitude and mentality.  She has been using Vyvanse since high school, with her dosage increased from a lower dose to 40 mg. She recalls previously being on a 50 mg dose before stopping due to insurance issues. She is considering a dosage adjustment to 50 mg to potentially improve focus, especially at work.  No symptoms of depression or anxiety. She reports sleeping approximately seven hours per night, although her dog occasionally disrupts her sleep. She has not experienced any significant side effects from her current medication regimen.  She is intentionally losing weight to fit into a wedding dress from a previous engagement. She engages in regular physical activity by walking and running with her dog, although she does not frequently use her gym membership.   Visit Diagnosis:    ICD-10-CM   1. Attention deficit hyperactivity disorder (ADHD), combined type  F90.2 lisdexamfetamine (VYVANSE) 50 MG capsule      Past Psychiatric History: I have reviewed past psychiatric history from progress note on 11/21/2021.  I have reviewed notes per Dr. Barker-03/20/2022-patient had neuropsychological testing  completed patient meet criteria for ADHD combined type.  Past Medical History:  Past Medical History:  Diagnosis Date   ADD (attention deficit disorder)    Diagnosed age 88 - probably showed signs of not focusing, short attn span, easily distracted   Anxiety    Asthma    Frequent headaches    GERD (gastroesophageal reflux disease)    Renal stones    UTI (urinary tract infection)     Past Surgical History:  Procedure Laterality Date   BREAST REDUCTION SURGERY     WISDOM TOOTH EXTRACTION      Family Psychiatric History: I have reviewed family psychiatric history from progress note on 11/21/2021.  Family History:  Family History  Problem Relation Age of Onset   Arthritis Mother    Depression Mother    Hypertension Mother    Miscarriages / [place] Mother    Arthritis Father    Cancer Father    Hypertension Father    Arthritis Maternal Grandmother    Hypertension Maternal Grandmother    Alcohol abuse Maternal Grandfather     Social History: I have reviewed social history from progress note on 11/21/2021. Social History   Socioeconomic History   Marital status: Single    Spouse name: Not on file   Number of children: Not on file   Years of education: Not on file   Highest education level: Some college, no degree  Occupational History   Occupation: bartender   Occupation: retail   Tobacco Use   Smoking status: Never   Smokeless tobacco: Never   Tobacco comments:    Vapes and  has been for 1.5 years.   Vaping Use   Vaping status: Every Day   Substances: Nicotine, Flavoring  Substance and Sexual Activity   Alcohol use: Yes    Alcohol/week: 2.0 standard drinks of alcohol    Types: 2 Cans of beer per week   Drug use: Not Currently   Sexual activity: Yes    Partners: Male    Birth control/protection: Implant  Other Topics Concern   Not on file  Social History Narrative   Not on file   Social Drivers of Health   Financial Resource Strain: Low Risk   (08/26/2018)   Received from [facility], [facility]   Overall Financial Resource Strain (CARDIA)    Difficulty of Paying Living Expenses: Not hard at all  Food Insecurity: No Food Insecurity (08/26/2018)   Received from [facility], [facility]   Hunger Vital Sign    Worried About Running Out of Food in the Last Year: Never true    Ran Out of Food in the Last Year: Never true  Transportation Needs: No Transportation Needs (08/26/2018)   Received from [facility], [facility]   [facility] - Transportation    In the past 12 months, has lack of transportation kept you from medical appointments or from getting medications?: No    Lack of Transportation (Non-Medical): No  Physical Activity: Sufficiently Active (06/26/2020)   Received from [facility], [facility]   Exercise Vital Sign    Days of Exercise per Week: 4 days    Minutes of Exercise per Session: 90 min  Stress: No Stress Concern Present (08/26/2018)   Received from [facility], [facility]   [employer] of Occupational Health - Occupational Stress Questionnaire    Feeling of Stress : Only a little  Social Connections: Moderately Integrated (08/26/2018)   Received from [facility], [facility]   Social Connection and Isolation Panel [NHANES]    Frequency of Communication with Friends and Family: More than three times a week    Frequency of Social Gatherings with Friends and Family: More than three times a week    Attends Religious Services: More than 4 times per year    Active Member of Clubs or Organizations: Yes    Attends Engineer, structural: More than 4 times per year    Marital Status: Never married    Allergies: No Known Allergies  Metabolic Disorder Labs: Lab Results  Component Value Date    HGBA1C 5.3 09/27/2019   No results found for: "PROLACTIN" Lab Results  Component Value Date   CHOL 128 09/27/2019   TRIG 56.0 09/27/2019   HDL 60.80 09/27/2019   CHOLHDL 2 09/27/2019   VLDL 11.2 09/27/2019   LDLCALC 56 09/27/2019   Lab Results  Component Value Date   TSH 1.50 09/27/2019    Therapeutic Level Labs: No results found for: "LITHIUM" No results found for: "VALPROATE" No results found for: "CBMZ"  Current Medications: Current Outpatient Medications  Medication Sig Dispense Refill   albuterol (VENTOLIN HFA) 108 (90 Base) MCG/ACT inhaler Inhale into the lungs every 6 (six) hours as needed for wheezing or shortness of breath.     amphetamine-dextroamphetamine (ADDERALL) 5 MG tablet Take 1 tablet (5 mg total) by mouth daily as needed. Take daily as needed at 4 PM 30 tablet 0   etonogestrel (NEXPLANON) 68 MG  IMPL implant Inject into the skin.     lisdexamfetamine (VYVANSE) 50 MG capsule Take 1 capsule (50 mg total) by mouth in the morning. 30 capsule 0   meloxicam (MOBIC) 15 MG tablet Take 15 mg by mouth daily. As needed     No current facility-administered medications for this visit.     Musculoskeletal: Strength & Muscle Tone: within normal limits Gait & Station: normal Patient leans: N/A  Psychiatric Specialty Exam: Review of Systems  Psychiatric/Behavioral:  Positive for decreased concentration and sleep disturbance.     Blood pressure 118/82, pulse (!) 101, temperature 98 F (36.7 C), temperature source Temporal, height 5\' 6"  (1.676 m), weight 177 lb 3.2 oz (80.4 kg), SpO2 100%.Body mass index is 28.6 kg/m.  General Appearance: Casual  Eye Contact:  Fair  Speech:  Clear and Coherent  Volume:  Normal  Mood:  Euthymic  Affect:  Appropriate  Thought Process:  Goal Directed and Descriptions of Associations: Intact  Orientation:  Full (Time, Place, and Person)  Thought Content: Logical   Suicidal Thoughts:  No  Homicidal Thoughts:  No  Memory:   Immediate;   Fair Recent;   Fair Remote;   Fair  Judgement:  Fair  Insight:  Fair  Psychomotor Activity:  Normal  Concentration:  Concentration: Fair and Attention Span: Fair  Recall:  [employer] of Knowledge: Fair  Language: Fair  Akathisia:  No  Handed:  Right  AIMS (if indicated): not done  Assets:  Desire for Improvement Housing Social Support  ADL's:  Intact  Cognition: WNL  Sleep:   varies due to having a puppy   Screenings: GAD-7    Loss adjuster, chartered Office Visit from 11/13/2022 in [facility] Office Visit from 06/29/2022 in [facility] Office Visit from 05/05/2022 in [facility] Office Visit from 11/21/2021 in [facility] Office Visit from 09/27/2019 in [facility] [employer]  Total GAD-7 Score 1 2 1 5 16       PHQ2-9    Flowsheet Row Office Visit from 11/13/2022 in [facility] Office Visit from 06/29/2022 in [facility] Office Visit from 05/05/2022 in [facility] Office Visit from 11/21/2021 in [facility] Office Visit from 09/27/2019 in [facility] Total Score 0 0 1 1 2   PHQ-9 Total Score -- 3 2 -- 16      Flowsheet Row Video Visit from 03/09/2023 in [facility] Office Visit from 11/13/2022 in [facility] Office Visit from 06/29/2022 in [facility]  C-SSRS RISK CATEGORY No Risk No Risk No Risk        Assessment and Plan:Kelly Bennett is a 23 year old Caucasian female, single, employed, lives in [place], has a history of ADHD, currently struggling with concentration  and focus some days, discussed assessment and plan as noted below.  Assessment & Plan Attention-Deficit/Hyperactivity Disorder (ADHD)-stable ADHD with variable response to current medication regimen. She reports a slight predominance of good days. Currently on 40 mg of Vyvanse and 5 mg of Adderall as needed. No significant depression or anxiety symptoms. Sleep is adequate, averaging 7 hours per night. Discussed potential need for dosage adjustment due to possible tolerance development. Emphasized importance of cognitive behavioral techniques and structure in  addition to medication. Discussed risks of increasing the dose, including side effects and dependency concerns. A trial of 50 mg Vyvanse is considered reasonable to assess effectiveness and side effects. - Prescribe 50 mg Vyvanse daily in the morning  - Monitor for side effects and effectiveness over a 30-day period. - Schedule follow-up appointment in 5-6 weeks to assess response to dosage change . - Sleep although varies due to having pet, overall is okay.  Will continue to reevaluate in future sessions. - Reviewed [place] PMP AWARxE   Tachycardia Elevated heart rate, possibly due to rushing to the appointment. No immediate concerns but advised to monitor heart rate, especially with medication change. - Advise wearing a smartwatch to monitor heart rate during the trial of 50 mg Vyvanse.   Follow-up Follow-up in clinic in 5 to 6 weeks or sooner in person.   Consent: Patient/Guardian gives verbal consent for treatment and assignment of benefits for services provided during this visit. Patient/Guardian expressed understanding and agreed to proceed.  This note was generated in part or whole with voice recognition software. Voice recognition is usually quite accurate but there are transcription errors that can and very often do occur. I apologize for any typographical errors that were not detected and corrected.     Jomarie Longs,  MD 06/09/2023, 5:06 PM

## 2023-08-11 ENCOUNTER — Encounter: Payer: Self-pay | Admitting: Psychiatry

## 2023-08-11 ENCOUNTER — Ambulatory Visit (INDEPENDENT_AMBULATORY_CARE_PROVIDER_SITE_OTHER): Admitting: Psychiatry

## 2023-08-11 ENCOUNTER — Other Ambulatory Visit: Payer: Self-pay

## 2023-08-11 VITALS — BP 113/80 | HR 76 | Temp 98.2°F | Ht 66.0 in | Wt 177.2 lb

## 2023-08-11 DIAGNOSIS — F902 Attention-deficit hyperactivity disorder, combined type: Secondary | ICD-10-CM

## 2023-08-11 DIAGNOSIS — Z79899 Other long term (current) drug therapy: Secondary | ICD-10-CM | POA: Diagnosis not present

## 2023-08-11 NOTE — Progress Notes (Signed)
 BH MD OP Progress Note  08/11/2023 4:54 PM Carlia Bomkamp  MRN:  [id number]  Chief Complaint:  Chief Complaint  Patient presents with   Follow-up   ADD   Discussed the use of AI scribe software for clinical note transcription with the patient, who gave verbal consent to proceed.  History of Present Illness Tvisha Schwoerer is a 23 year old Caucasian female, employed, engaged, lives in [place], has a history of ADHD was evaluated in office today for medication management.  She is currently taking Vyvanse  50 mg for ADHD, which has been effective overall. She experiences occasional headaches that improve with increased water intake and has noticed an increase in body odor, which she attributes to the medication. She manages this by using stronger deodorants and other hygiene products.  She recently switched jobs and had a period of about two and a half weeks off work, during which she did not take Vyvanse . She has been consistently taking the medication for the past three to four weeks since starting her new job.  Her weight has decreased from 181 pounds to 177 pounds, which she attributes to healthier eating habits and reduced snacking, aided by the medication. She primarily drinks water and avoids sugary drinks, including alcohol.  She reports sleeping well generally, although she occasionally experiences tiredness during the day. She is a light sleeper and is sometimes woken by her seven-month-old puppy.  Denies any suicidality, homicidality or perceptual disturbances.    Visit Diagnosis:    ICD-10-CM   1. Attention deficit hyperactivity disorder (ADHD), combined type  F90.2     2. High risk medication use  Z79.899 Urine drugs of abuse scrn w alc, routine (Ref Lab)      Past Psychiatric History: I have reviewed past psychiatric history from progress note on 11/21/2021.  I have reviewed notes per Dr. Barker-03/20/2022-patient had neuropsychological testing completed, meets criteria for ADHD  combined type.  Past Medical History:  Past Medical History:  Diagnosis Date   ADD (attention deficit disorder)    Diagnosed age 31 - probably showed signs of not focusing, short attn span, easily distracted   Anxiety    Asthma    Frequent headaches    GERD (gastroesophageal reflux disease)    Renal stones    UTI (urinary tract infection)     Past Surgical History:  Procedure Laterality Date   BREAST REDUCTION SURGERY     WISDOM TOOTH EXTRACTION      Family Psychiatric History: I have reviewed family psychiatric history from progress note on 11/21/2021.  Family History:  Family History  Problem Relation Age of Onset   Arthritis Mother    Depression Mother    Hypertension Mother    Miscarriages / [place] Mother    Arthritis Father    Cancer Father    Hypertension Father    Arthritis Maternal Grandmother    Hypertension Maternal Grandmother    Alcohol abuse Maternal Grandfather     Social History: I have reviewed social history from progress note on 11/21/2021. Social History   Socioeconomic History   Marital status: Single    Spouse name: Not on file   Number of children: Not on file   Years of education: Not on file   Highest education level: Some college, no degree  Occupational History   Occupation: bartender   Occupation: retail   Tobacco Use   Smoking status: Never   Smokeless tobacco: Never   Tobacco comments:    Vapes and has been  for 1.5 years.   Vaping Use   Vaping status: Every Day   Substances: Nicotine, Flavoring  Substance and Sexual Activity   Alcohol use: Yes    Alcohol/week: 2.0 standard drinks of alcohol    Types: 2 Cans of beer per week   Drug use: Not Currently   Sexual activity: Yes    Partners: Male    Birth control/protection: Implant  Other Topics Concern   Not on file  Social History Narrative   Not on file   Social Drivers of Health   Financial Resource Strain: Low Risk  (08/26/2018)   Received from [facility]   Overall Financial Resource Strain (CARDIA)    Difficulty of Paying Living Expenses: Not hard at all  Food Insecurity: No Food Insecurity (08/26/2018)   Received from [facility]   Hunger Vital Sign    Within the past 12 months, you worried that your food would run out before you got the money to buy more.: Never true    Within the past 12 months, the food you bought just didn't last and you didn't have money to get more.: Never true  Transportation Needs: No Transportation Needs (08/26/2018)   Received from [facility] - Transportation    In the past 12 months, has lack of transportation kept you from medical appointments or from getting medications?: No    Lack of Transportation (Non-Medical): No  Physical Activity: Sufficiently Active (06/26/2020)   Received from [facility]   Exercise Vital Sign    On average, how many days per week do you engage in moderate to strenuous exercise (like a brisk walk)?: 4 days    On average, how many minutes do you engage in exercise at this level?: 90 min  Stress: No Stress Concern Present (08/26/2018)   Received from [facility] of Occupational Health - Occupational Stress Questionnaire    Feeling of Stress : Only a little  Social Connections: Moderately Integrated (08/26/2018)   Received from [facility]   Social Connection and Isolation Panel    In a typical week, how many times do you talk on the phone with family, friends, or neighbors?: More than three times a week    How often do you get together with friends or relatives?: More than three times a week    How often do you attend church or religious services?: More than 4 times per year    Do you belong to any clubs or organizations such as church groups, unions, fraternal or athletic groups, or school groups?: Yes    How often do you attend meetings of the clubs or  organizations you belong to?: More than 4 times per year    Are you married, widowed, divorced, separated, never married, or living with a partner?: Never married    Allergies: No Known Allergies  Metabolic Disorder Labs: Lab Results  Component Value Date   HGBA1C 5.3 09/27/2019   No results found for: PROLACTIN Lab Results  Component Value Date   CHOL 128 09/27/2019   TRIG 56.0 09/27/2019   HDL 60.80 09/27/2019   CHOLHDL 2 09/27/2019   VLDL 11.2 09/27/2019   LDLCALC 56 09/27/2019   Lab Results  Component Value Date   TSH 1.50 09/27/2019    Therapeutic Level Labs: No results found for: LITHIUM No results found for: VALPROATE No results found for: CBMZ  Current  Medications: Current Outpatient Medications  Medication Sig Dispense Refill   albuterol (VENTOLIN HFA) 108 (90 Base) MCG/ACT inhaler Inhale into the lungs every 6 (six) hours as needed for wheezing or shortness of breath.     etonogestrel (NEXPLANON) 68 MG IMPL implant Inject into the skin.     meloxicam  (MOBIC ) 15 MG tablet Take 15 mg by mouth daily. As needed     amphetamine -dextroamphetamine  (ADDERALL) 5 MG tablet Take 1 tablet (5 mg total) by mouth daily as needed. Take daily as needed at 4 PM 30 tablet 0   lisdexamfetamine (VYVANSE ) 50 MG capsule Take 1 capsule (50 mg total) by mouth in the morning. 30 capsule 0   No current facility-administered medications for this visit.     Musculoskeletal: Strength & Muscle Tone: within normal limits Gait & Station: normal Patient leans: N/A  Psychiatric Specialty Exam: Review of Systems  Psychiatric/Behavioral: Negative.      Blood pressure 113/80, pulse 76, temperature 98.2 F (36.8 C), temperature source Temporal, height 5' 6 (1.676 m), weight 177 lb 3.2 oz (80.4 kg).Body mass index is 28.6 kg/m.  General Appearance: Casual  Eye Contact:  Fair  Speech:  Clear and Coherent  Volume:  Normal  Mood:  Euthymic  Affect:  Congruent  Thought Process:   Goal Directed and Descriptions of Associations: Intact  Orientation:  Full (Time, Place, and Person)  Thought Content: Logical   Suicidal Thoughts:  No  Homicidal Thoughts:  No  Memory:  Immediate;   Fair Recent;   Fair Remote;   Fair  Judgement:  Fair  Insight:  Fair  Psychomotor Activity:  Normal  Concentration:  Concentration: Fair and Attention Span: Fair  Recall:  [employer] of Knowledge: Fair  Language: Fair  Akathisia:  No  Handed:  Right  AIMS (if indicated): not done  Assets:  Communication Skills Desire for Improvement Housing Intimacy Social Support Talents/Skills Transportation  ADL's:  Intact  Cognition: WNL  Sleep:  Fair   Screenings: GAD-7    Garment/textile technologist Visit from 06/09/2023 in [facility] Office Visit from 11/13/2022 in [facility] Office Visit from 06/29/2022 in [facility] Office Visit from 05/05/2022 in [facility] Office Visit from 11/21/2021 in [facility]  Total GAD-7 Score 1 1 2 1 5    PHQ2-9    Flowsheet Row Office Visit from 06/09/2023 in [facility] Office Visit from 11/13/2022 in [facility] Office Visit from 06/29/2022 in [facility] Office Visit from 05/05/2022 in [facility] Office Visit from 11/21/2021 in [facility]  PHQ-2 Total Score 0 0 0 1 1  PHQ-9 Total Score -- -- 3 2 --   Flowsheet Row Office Visit from 08/11/2023 in [facility] Office Visit from 06/09/2023 in [facility] Video Visit from 03/09/2023 in [facility]  C-SSRS  RISK CATEGORY No Risk No Risk No Risk     Assessment and Plan: Jamelle Noy is a 23 year old Caucasian female, single, employed, has a history of ADHD was evaluated in office today, discussed assessment and plan as noted below.  Attention deficit hyperactivity disorder-stable Currently reports attention and focus as improved on the current dosage of Vyvanse .  Denies any side effects. Continue Vyvanse   50 mg daily in the morning Reviewed [place] PMP AWARxE  High risk medication use-patient currently on stimulants will benefit from urine drug screen since her last one was more than a year ago.  Patient to go to [facility] lab.  UDS ordered.   Follow-up Follow-up in clinic in 5 months or sooner if needed.   Consent: Patient/Guardian gives verbal consent for treatment and assignment of benefits for services provided during this visit. Patient/Guardian expressed understanding and agreed to proceed.   This note was generated in part or whole with voice recognition software. Voice recognition is usually quite accurate but there are transcription errors that can and very often do occur. I apologize for any typographical errors that were not detected and corrected.    Amenda Duclos, MD 08/13/2023, 10:17 AM

## 2023-08-13 ENCOUNTER — Telehealth: Payer: Self-pay

## 2023-08-13 ENCOUNTER — Other Ambulatory Visit
Admission: RE | Admit: 2023-08-13 | Discharge: 2023-08-13 | Disposition: A | Discharge status: Home / Self Care | Source: Ambulatory Visit | Attending: Psychiatry | Admitting: Psychiatry

## 2023-08-13 DIAGNOSIS — F902 Attention-deficit hyperactivity disorder, combined type: Secondary | ICD-10-CM

## 2023-08-13 DIAGNOSIS — Z79899 Other long term (current) drug therapy: Secondary | ICD-10-CM | POA: Diagnosis present

## 2023-08-13 MED ORDER — LISDEXAMFETAMINE DIMESYLATE 50 MG PO CAPS: 50.0000 mg | ORAL_CAPSULE | Freq: Every morning | ORAL | 0 refills | Status: DC

## 2023-08-13 NOTE — Telephone Encounter (Signed)
 I have sent a 90 days supply to pharmacy for vyvanse . Please remind to get UDS completed.

## 2023-08-13 NOTE — Telephone Encounter (Signed)
 pt called states that the insurance will pay for a 90 day supply of the vyvanse . please send to [facility] pharmacy on [address]. pt was last seen on 6-11 next appt 10-21

## 2023-08-13 NOTE — Telephone Encounter (Signed)
 Please ask her if she is willing to wait until Dr. Tere Felts returns next week. As I am covering, I do not feel comfortable prescribing a 90-day supply of a controlled substance. Alternatively, I can send a short supply to the local pharmacy if needed. Also, please advise her to obtain the urine screening as previously recommended by Dr. Eappen if it has not yet been completed.

## 2023-08-16 ENCOUNTER — Encounter: Payer: Self-pay | Admitting: Physician Assistant

## 2023-08-16 NOTE — Telephone Encounter (Signed)
 left message that medication sent to pharmacy after hours on friday and to also remind her to get UDS

## 2023-08-19 ENCOUNTER — Ambulatory Visit: Payer: Self-pay | Admitting: Psychiatry

## 2023-08-19 LAB — URINE DRUGS OF ABUSE SCREEN W ALC, ROUTINE (REF LAB)
Barbiturate, Ur: NEGATIVE ng/mL
Benzodiazepine Quant, Ur: NEGATIVE ng/mL
Cannabinoid Quant, Ur: NEGATIVE ng/mL
Cocaine (Metab.): NEGATIVE ng/mL
Creatinine, Urine: 161.3 mg/dL (ref 20.0–300.0)
Ethanol U, Quan: NEGATIVE %
Methadone Screen, Urine: NEGATIVE ng/mL
Nitrite Urine, Quantitative: NEGATIVE ug/mL
OPIATE SCREEN URINE: NEGATIVE ng/mL
Phencyclidine, Ur: NEGATIVE ng/mL
Propoxyphene, Urine: NEGATIVE ng/mL
pH, Urine: 5.9 (ref 4.5–8.9)

## 2023-08-19 LAB — AMPHETAMINE CONF, UR
Amphetamine GC/MS Conf: >3000 ng/mL
Amphetamine, Ur: POSITIVE — AB
Amphetamines: POSITIVE — AB
Methamphetamine, Ur: NEGATIVE

## 2023-10-05 ENCOUNTER — Ambulatory Visit: Admitting: Physician Assistant

## 2023-10-05 ENCOUNTER — Encounter: Payer: Self-pay | Admitting: Physician Assistant

## 2023-10-05 ENCOUNTER — Other Ambulatory Visit (INDEPENDENT_AMBULATORY_CARE_PROVIDER_SITE_OTHER)

## 2023-10-05 VITALS — BP 104/68 | HR 92 | Ht 66.0 in | Wt 181.2 lb

## 2023-10-05 DIAGNOSIS — R140 Abdominal distension (gaseous): Secondary | ICD-10-CM

## 2023-10-05 DIAGNOSIS — K219 Gastro-esophageal reflux disease without esophagitis: Secondary | ICD-10-CM

## 2023-10-05 DIAGNOSIS — R1084 Generalized abdominal pain: Secondary | ICD-10-CM

## 2023-10-05 DIAGNOSIS — R195 Other fecal abnormalities: Secondary | ICD-10-CM

## 2023-10-05 DIAGNOSIS — R141 Gas pain: Secondary | ICD-10-CM

## 2023-10-05 DIAGNOSIS — R197 Diarrhea, unspecified: Secondary | ICD-10-CM

## 2023-10-05 LAB — CBC WITH DIFFERENTIAL/PLATELET
Basophils Absolute: 0 K/uL (ref 0.0–0.1)
Basophils Relative: 0.8 % (ref 0.0–3.0)
Eosinophils Absolute: 0.1 K/uL (ref 0.0–0.7)
Eosinophils Relative: 1.8 % (ref 0.0–5.0)
HCT: 45.2 % (ref 36.0–46.0)
Hemoglobin: 15.3 g/dL — ABNORMAL HIGH (ref 12.0–15.0)
Lymphocytes Relative: 29 % (ref 12.0–46.0)
Lymphs Abs: 1.4 K/uL (ref 0.7–4.0)
MCHC: 33.8 g/dL (ref 30.0–36.0)
MCV: 88 fl (ref 78.0–100.0)
Monocytes Absolute: 0.4 K/uL (ref 0.1–1.0)
Monocytes Relative: 7.9 % (ref 3.0–12.0)
Neutro Abs: 3 K/uL (ref 1.4–7.7)
Neutrophils Relative %: 60.5 % (ref 43.0–77.0)
Platelets: 284 K/uL (ref 150.0–400.0)
RBC: 5.13 Mil/uL — ABNORMAL HIGH (ref 3.87–5.11)
RDW: 12.6 % (ref 11.5–15.5)
WBC: 4.9 K/uL (ref 4.0–10.5)

## 2023-10-05 LAB — COMPREHENSIVE METABOLIC PANEL WITH GFR
ALT: 15 U/L (ref 0–35)
AST: 12 U/L (ref 0–37)
Albumin: 4.6 g/dL (ref 3.5–5.2)
Alkaline Phosphatase: 58 U/L (ref 39–117)
BUN: 12 mg/dL (ref 6–23)
CO2: 28 meq/L (ref 19–32)
Calcium: 9.4 mg/dL (ref 8.4–10.5)
Chloride: 103 meq/L (ref 96–112)
Creatinine, Ser: 0.59 mg/dL (ref 0.40–1.20)
GFR: 125.54 mL/min (ref >60.00)
Glucose, Bld: 89 mg/dL (ref 70–99)
Potassium: 4.3 meq/L (ref 3.5–5.1)
Sodium: 138 meq/L (ref 135–145)
Total Bilirubin: 0.9 mg/dL (ref 0.2–1.2)
Total Protein: 7.8 g/dL (ref 6.0–8.3)

## 2023-10-05 LAB — LIPASE: Lipase: 20 U/L (ref 11.0–59.0)

## 2023-10-05 MED ORDER — OMEPRAZOLE 20 MG PO CPDR: 20.0000 mg | DELAYED_RELEASE_CAPSULE | Freq: Every day | ORAL | 2 refills | Status: DC

## 2023-10-05 NOTE — Patient Instructions (Addendum)
 Your provider has requested that you go to the basement level for lab work before leaving today. Press B on the elevator. The lab is located at the first door on the left as you exit the elevator.  We have sent the following medications to your pharmacy for you to pick up at your convenience: Omeprazole  20 mg daily 30-60 minutes before breakfast.   Start Benefiber or Citrucel 1-2 tablespoons in 8 ounces of liquid daily.  _______________________________________________________  If your blood pressure at your visit was 140/90 or greater, please contact your primary care physician to follow up on this.  _______________________________________________________  If you are age 55 or older, your body mass index should be between 23-30. Your Body mass index is 29.25 kg/m. If this is out of the aforementioned range listed, please consider follow up with your Primary Care Provider.  If you are age 61 or younger, your body mass index should be between 19-25. Your Body mass index is 29.25 kg/m. If this is out of the aformentioned range listed, please consider follow up with your Primary Care Provider.   ________________________________________________________  The [facility] providers would like to encourage you to use MYCHART to communicate with providers for non-urgent requests or questions.  Due to long hold times on the telephone, sending your provider a message by [facility] may be a faster and more efficient way to get a response.  Please allow 48 business hours for a response.  Please remember that this is for non-urgent requests.  _______________________________________________________  [facility] is using a team-based approach to care.  Your team is made up of your doctor and two to three APPS. Our APPS (Nurse Practitioners and Physician Assistants) work with your physician to ensure care continuity for you. They are fully qualified to address your health concerns and develop a  treatment plan. They communicate directly with your gastroenterologist to care for you. Seeing the Advanced Practice Practitioners on your physician's team can help you by facilitating care more promptly, often allowing for earlier appointments, access to diagnostic testing, procedures, and other specialty referrals.

## 2023-10-05 NOTE — Progress Notes (Signed)
 Chief Complaint: Abdominal pain and GERD  HPI:    Kelly Bennett is a 23 year old female with a past medical history as listed below including anxiety, GERD and multiple others, who was referred to me by Bounvilay, Celena, PA-C for a complaint of abdominal pain and GERD.      04/15/2022 right upper quadrant ultrasound done for elevated LFTs was normal.    04/15/2022 CTAP with contrast for abdominal pain and black tarry stools with no clear abnormal wall thickening, mild splenomegaly.    Today, patient presents to clinic and tells me that she came in because the females in her family including her maternal grandmother have always had GI issues.  Her maternal grandmother actually has ulcerative colitis.  She describes that she has had these symptoms for years but always thought they were somewhat normal.  Her biggest complaint is of gas pain and sometimes urgent diarrhea which can occur 30 to 45 minutes after eating.  This is the most common thing that happens and sometimes she finds herself with her feet up in the air to help with the pain.  This can occur 3/7 days of the week.  Certainly dependent upon what she eats.  When she has a bowel movement it is typically loose stool or she cannot go at all that day, she does have some form occasionally, but still on the looser side.  Also describes some reflux which occurs easily, she has noticed some trigger foods including onions, tomatoes and possibly even bread.  Also describes occasional hemorrhoids, currently not a problem.  She typically just waits for them to go back away.    Denies fever, chills, weight loss or symptoms that awaken her from sleep.  Past Medical History:  Diagnosis Date   ADD (attention deficit disorder)    Diagnosed age 9 - probably showed signs of not focusing, short attn span, easily distracted   Anxiety    Asthma    Frequent headaches    GERD (gastroesophageal reflux disease)    Renal stones    UTI (urinary tract infection)      Past Surgical History:  Procedure Laterality Date   BREAST REDUCTION SURGERY     WISDOM TOOTH EXTRACTION      Current Outpatient Medications  Medication Sig Dispense Refill   albuterol (VENTOLIN HFA) 108 (90 Base) MCG/ACT inhaler Inhale into the lungs every 6 (six) hours as needed for wheezing or shortness of breath.     amphetamine -dextroamphetamine  (ADDERALL) 5 MG tablet Take 1 tablet (5 mg total) by mouth daily as needed. Take daily as needed at 4 PM 30 tablet 0   etonogestrel (NEXPLANON) 68 MG IMPL implant Inject into the skin.     lisdexamfetamine (VYVANSE ) 50 MG capsule Take 1 capsule (50 mg total) by mouth in the morning. 90 capsule 0   meloxicam  (MOBIC ) 15 MG tablet Take 15 mg by mouth daily. As needed     No current facility-administered medications for this visit.    Allergies as of 10/05/2023   (No Known Allergies)    Family History  Problem Relation Age of Onset   Arthritis Mother    Depression Mother    Hypertension Mother    Miscarriages / [place] Mother    Arthritis Father    Cancer Father    Hypertension Father    Arthritis Maternal Grandmother    Hypertension Maternal Grandmother    Alcohol abuse Maternal Grandfather     Social History   Socioeconomic History  Marital status: Single    Spouse name: Not on file   Number of children: Not on file   Years of education: Not on file   Highest education level: Some college, no degree  Occupational History   Occupation: bartender   Occupation: retail   Tobacco Use   Smoking status: Never   Smokeless tobacco: Never   Tobacco comments:    Vapes and has been for 1.5 years.   Vaping Use   Vaping status: Every Day   Substances: Nicotine, Flavoring  Substance and Sexual Activity   Alcohol use: Yes    Alcohol/week: 2.0 standard drinks of alcohol    Types: 2 Cans of beer per week   Drug use: Not Currently   Sexual activity: Yes    Partners: Male    Birth control/protection: Implant  Other  Topics Concern   Not on file  Social History Narrative   Not on file   Social Drivers of Health   Financial Resource Strain: Low Risk  (08/26/2018)   Received from [facility]   Overall Financial Resource Strain (CARDIA)    Difficulty of Paying Living Expenses: Not hard at all  Food Insecurity: No Food Insecurity (08/26/2018)   Received from [facility]   Hunger Vital Sign    Within the past 12 months, you worried that your food would run out before you got the money to buy more.: Never true    Within the past 12 months, the food you bought just didn't last and you didn't have money to get more.: Never true  Transportation Needs: No Transportation Needs (08/26/2018)   Received from [facility] - Transportation    In the past 12 months, has lack of transportation kept you from medical appointments or from getting medications?: No    Lack of Transportation (Non-Medical): No  Physical Activity: Sufficiently Active (06/26/2020)   Received from [facility]   Exercise Vital Sign    On average, how many days per week do you engage in moderate to strenuous exercise (like a brisk walk)?: 4 days    On average, how many minutes do you engage in exercise at this level?: 90 min  Stress: No Stress Concern Present (08/26/2018)   Received from [facility] - Occupational Stress Questionnaire    Feeling of Stress : Only a little  Social Connections: Moderately Integrated (08/26/2018)   Received from [facility]   Social Connection and Isolation Panel    In a typical week, how many times do you talk on the phone with family, friends, or neighbors?: More than three times a week    How often do you get together with friends or relatives?: More than three times a week    How often do you attend church or religious services?: More than 4 times per year     Do you belong to any clubs or organizations such as church groups, unions, fraternal or athletic groups, or school groups?: Yes    How often do you attend meetings of the clubs or organizations you belong to?: More than 4 times per year    Are you married, widowed, divorced, separated, never married, or living with a partner?: Never married  Intimate Partner Violence: Not on file    Review of Systems:    Constitutional: No weight loss, fever or chills Skin: No rash  Cardiovascular: No  chest pain Respiratory: No SOB Gastrointestinal: See HPI and otherwise negative Genitourinary: No dysuria Neurological: No headache, dizziness or syncope Musculoskeletal: No new muscle or joint pain Hematologic: No bruising Psychiatric: No history of depression or anxiety   Physical Exam:  Vital signs: BP 104/68 (BP Location: Left Arm, Patient Position: Sitting, Cuff Size: Normal)   Pulse 92   Ht 5' 6 (1.676 m)   Wt 181 lb 4 oz (82.2 kg)   BMI 29.25 kg/m    Constitutional:   Pleasant Caucasian female appears to be in NAD, Well developed, Well nourished, alert and cooperative Head:  Normocephalic and atraumatic. Eyes:   PEERL, EOMI. No icterus. Conjunctiva pink. Ears:  Normal auditory acuity. Neck:  Supple Throat: Oral cavity and pharynx without inflammation, swelling or lesion.  Respiratory: Respirations even and unlabored. Lungs clear to auscultation bilaterally.   No wheezes, crackles, or rhonchi.  Cardiovascular: Normal S1, S2. No MRG. Regular rate and rhythm. No peripheral edema, cyanosis or pallor.  Gastrointestinal:  Soft, nondistended, mild epigastric ttp. No rebound or guarding. Normal bowel sounds. No appreciable masses or hepatomegaly. Rectal:  Not performed.  Msk:  Symmetrical without gross deformities. Without edema, no deformity or joint abnormality.  Neurologic:  Alert and  oriented x4;  grossly normal neurologically.  Skin:   Dry and intact without significant lesions or  rashes. Psychiatric: Demonstrates good judgement and reason without abnormal affect or behaviors.  RELEVANT LABS AND IMAGING: CBC    Component Value Date/Time   WBC 5.7 04/15/2022 1029   RBC 5.12 (H) 04/15/2022 1029   HGB 14.8 04/15/2022 1029   HCT 45.1 04/15/2022 1029   PLT 295 04/15/2022 1029   MCV 88.1 04/15/2022 1029   MCH 28.9 04/15/2022 1029   MCHC 32.8 04/15/2022 1029   RDW 12.1 04/15/2022 1029   LYMPHSABS 2.9 05/04/2019 0215   MONOABS 0.8 05/04/2019 0215   EOSABS 0.1 05/04/2019 0215   BASOSABS 0.0 05/04/2019 0215    CMP     Component Value Date/Time   NA 138 04/15/2022 1029   K 4.0 04/15/2022 1029   CL 106 04/15/2022 1029   CO2 24 04/15/2022 1029   GLUCOSE 124 (H) 04/15/2022 1029   BUN 13 04/15/2022 1029   CREATININE 0.60 04/15/2022 1029   CALCIUM 9.3 04/15/2022 1029   PROT 7.7 04/15/2022 1029   ALBUMIN 4.2 04/15/2022 1029   AST 63 (H) 04/15/2022 1029   ALT 248 (H) 04/15/2022 1029   ALKPHOS 122 04/15/2022 1029   BILITOT 1.3 (H) 04/15/2022 1029   GFRNONAA >60 04/15/2022 1029   GFRAA >60 05/04/2019 0215    Assessment: 1.  Generalized abdominal pain: She describes as gas pain that seems better when she passes gas or has a bowel movement can occur 3 out of 7 days of the week, sometimes related to food she eats, also occasional reflux, grandmother with a history of UC, no prior GI workup; consider IBS versus celiac disease versus IBD 2.  Gas pain: With above 3.  GERD: Worse with onions and tomatoes or even sometimes breads and alcohol; consider gastritis +/- celiac disease 4.  Loose stool: Typically loose stool, sometimes urgent after eating, does admit to being a highly emotional person; consider IBS most likely  Plan: 1.  Labs today to include a CBC, CMP, lipase, IgA and itTG 2.  Recommend the patient start a fiber supplement such as Metamucil, Citrucel or Benefiber or a fiber gummy.  Discussed water intake of at least 6-8 eight ounce glasses  of water per  day. 3.  Started the patient on Omeprazole  20 mg every morning, 30 minutes before breakfast #30 with 1 refill. 4.  Discussed with patient that all of this sounds like a constellation of gastritis +/- celiac +/- IBS.  If she does not feel better after measures above could consider an EGD and colonoscopy when she comes in for follow-up 5.  Patient to follow in clinic with me in 2 to 3 months or sooner if necessary.  Assigned to Dr. Nandigam today.  Delon Failing, PA-C  Gastroenterology 10/05/2023, 11:08 AM  Cc: Bounvilay, Celena, PA-C

## 2023-10-06 LAB — TISSUE TRANSGLUTAMINASE ABS,IGG,IGA
(tTG) Ab, IgA: <1 U/mL
(tTG) Ab, IgG: <1 U/mL

## 2023-10-06 LAB — IGA: Immunoglobulin A: 459 mg/dL — ABNORMAL HIGH (ref 47–310)

## 2023-10-07 ENCOUNTER — Ambulatory Visit: Payer: Self-pay | Admitting: Physician Assistant

## 2023-10-07 NOTE — Progress Notes (Signed)
 Labs look good.  No concerns.  Delon Failing, PA-C

## 2023-12-07 ENCOUNTER — Ambulatory Visit

## 2023-12-07 DIAGNOSIS — L700 Acne vulgaris: Secondary | ICD-10-CM

## 2023-12-07 MED ORDER — ADAPALENE 0.3 % EX GEL: CUTANEOUS | 5 refills | Status: AC

## 2023-12-07 MED ORDER — CLINDAMYCIN PHOS (TWICE-DAILY) 1 % EX GEL: Freq: Two times a day (BID) | CUTANEOUS | 5 refills | Status: DC

## 2023-12-07 NOTE — Patient Instructions (Addendum)
 Plan for Acne  In the morning: - Cleanse face with a gentle cleanser OR benzoyl peroxide wash if instructed to use this at your visit(can be purchased over the counter- examples at the bottom) - Wipe face with clindamycin wipe if prescribed at your visit. This can be used on entire face/chest/back or as a spot treatment to active acne areas  - Apply an oil-free moisturizer  In the evening: - Cleanse face with a regular gentle face wash - Wait for skin to completely dry - Apply a pea sized amount of your retinoid (tretinoin or adapalene) to your finger. Dot this around your face, and then rub in - If your skin gets dry, you can follow this up with an oil-free moisturizer  How to use your Acne creams  Some creams for acne PREVENT acne and some creams TARGET pimples you can see.  Antibiotic creams (erythromycin, benzoyl peroxide, clindamycin and sulfur sulfacetamide)  How to apply: generally applied once daily either as a spot treatment or all over the face (see above)  Retinoids (differin/adapalene, retin-A/tretinoin or tazorac) work by PREVENTING acne.  These medicines are good to control acne, but may cause dryness and increase your risk of sunburn.  Do not use if you are PREGNANT or BREAST FEEDING. It takes 4-6 weeks to have results and the acne may worsen in the beginning.  Some retinoids are stronger than others, but are also more irritating. These are prescription topical medications, used to treat acne, sun damage, fine wrinkles, and several other skin changes on the face. Medications in this family include tretinoin/Retin-A, adapalene/Differin, and Tazorac, among others.   A Note on Cosmetic Use: - Please note, if you are over the age of 58, insurance will typically not cover these medications. If they are recommended to you for non-medically necessary reasons, you may choose to pay for the medication out of pocket. Prices vary, but a tube of generic tretinoin can often be purchased  for ~$60-100 (this size often lasts several months). This varies considerably, and we recommended that you check www.goodrx.com for prescription discount coupons and to compare prices across pharmacies.   How to apply: Use at night time (sunlight makes them inactive). If you wash your face at night, let the skin dry for 20 minutes before applying. Apply to all areas that have breakouts (NOT just to pimples you see). Use a PEA-SIZED amount for the entire face (no more) Dot it on your skin and connect the dots Avoid eyes and lips These may cause irritation in the beginning. To decrease irritation, do the following: 1st month: Use twice a week for the first month  2nd month: Use every other night 3rd month and on: Use every night  Cleansing your skin: -   Do not use harsh "acne" soaps and astringents. - Use water to clean your face.  - If you wear make-up, sunscreen or creams, use non-comedogenic (non-pore clogging) gentle moisturizing wash or cream. Examples include: Cetaphil, Neutrogena, Clinique  Moisturizers and sunscreen: Apply a "non-comedogenic" (non-pore clogging) lotion with sunscreen in the morning. You may need to re-apply during the day. Neutrogena, Eucerin, Clinique, Vanicream, Elta MD    If you have dryness or irritation, try these tips: Decrease use to every other night or twice weekly as tolerated  Wash the retinoid off after 1 hour and apply a "non-comedogenic" (non-pore-clogging) lotion If dryness or irritation continues, stop using the retinoid.   Benzoyl peroxide washes 3-5% (brand name includes Neutragena Clear Pore, CereVe Acne  Foaming Cream Cleanser, Differin Cleanser) that you use in the shower. Can bleach linens (clothes, towels, etc), will NOT bleach your skin or hair). Dry off with a white towel so it doesn't take the color out of clothing and colored towels.       Due to recent changes in healthcare laws, you may see results of your pathology and/or  laboratory studies on MyChart before the doctors have had a chance to review them. We understand that in some cases there may be results that are confusing or concerning to you. Please understand that not all results are received at the same time and often the doctors may need to interpret multiple results in order to provide you with the best plan of care or course of treatment. Therefore, we ask that you please give us  2 business days to thoroughly review all your results before contacting the office for clarification. Should we see a critical lab result, you will be contacted sooner.   If You Need Anything After Your Visit  If you have any questions or concerns for your doctor, please call our main line at [phone] and press option 4 to reach your doctor's medical assistant. If no one answers, please leave a voicemail as directed and we will return your call as soon as possible. Messages left after 4 pm will be answered the following business day.   You may also send us  a message via MyChart. We typically respond to MyChart messages within 1-2 business days.  For prescription refills, please ask your pharmacy to contact our office. Our fax number is [phone].  If you have an urgent issue when the clinic is closed that cannot wait until the next business day, you can page your doctor at the number below.    Please note that while we do our best to be available for urgent issues outside of office hours, we are not available 24/7.   If you have an urgent issue and are unable to reach us , you may choose to seek medical care at your doctor's office, retail clinic, urgent care center, or emergency room.  If you have a medical emergency, please immediately call 911 or go to the emergency department.  Pager Numbers  - Dr. Hester: [phone]  - Dr. Jackquline: [phone]  - Dr. Claudene: [phone]   - Dr. Raymund: [phone]  In the event of inclement weather, please call our main  line at [phone] for an update on the status of any delays or closures.  Dermatology Medication Tips: Please keep the boxes that topical medications come in in order to help keep track of the instructions about where and how to use these. Pharmacies typically print the medication instructions only on the boxes and not directly on the medication tubes.   If your medication is too expensive, please contact our office at [phone] option 4 or send us  a message through MyChart.   We are unable to tell what your co-pay for medications will be in advance as this is different depending on your insurance coverage. However, we may be able to find a substitute medication at lower cost or fill out paperwork to get insurance to cover a needed medication.   If a prior authorization is required to get your medication covered by your insurance company, please allow us  1-2 business days to complete this process.  Drug prices often vary depending on where the prescription is filled and some pharmacies may offer cheaper prices.  The website www.goodrx.com contains coupons  for medications through different pharmacies. The prices here do not account for what the cost may be with help from insurance (it may be cheaper with your insurance), but the website can give you the price if you did not use any insurance.  - You can print the associated coupon and take it with your prescription to the pharmacy.  - You may also stop by our office during regular business hours and pick up a GoodRx coupon card.  - If you need your prescription sent electronically to a different pharmacy, notify our office through [facility] or by phone at [phone] option 4.     Si Usted Necesita Algo Despus de Su Visita  Tambin puede enviarnos un mensaje a travs de Clinical cytogeneticist. Por lo general respondemos a los mensajes de MyChart en el transcurso de 1 a 2 das hbiles.  Para renovar recetas, por favor pida a su farmacia que  se ponga en contacto con nuestra oficina. Randi lakes de fax es [place] [phone].  Si tiene un asunto urgente cuando la clnica est cerrada y que no puede esperar hasta el siguiente da hbil, puede llamar/localizar a su doctor(a) al nmero que aparece a continuacin.   Por favor, tenga en cuenta que aunque hacemos todo lo posible para estar disponibles para asuntos urgentes fuera del horario de [place], no estamos disponibles las 24 horas del da, los 7 [address]  [address] de la [place].   Si tiene un problema urgente y no puede comunicarse con nosotros, puede optar por buscar atencin mdica  en el consultorio de su doctor(a), en una clnica privada, en un centro de atencin urgente o en una sala de emergencias.  Si tiene Engineer, drilling, por favor llame inmediatamente al 911 o vaya a la sala de emergencias.  Nmeros de bper  - Dr. Hester: [phone]  - Dra. Jackquline: [phone]  - Dr. Claudene: [phone]  - Dra. Rachit Grim: [phone]  En caso de inclemencias del [place], por favor llame a nuestra lnea principal al [phone] para una actualizacin sobre el estado de cualquier retraso o cierre.  Consejos para la medicacin en dermatologa: Por favor, guarde las cajas en las que vienen los medicamentos de uso tpico para ayudarle a seguir las instrucciones sobre dnde y cmo usarlos. Las farmacias generalmente imprimen las instrucciones del medicamento slo en las cajas y no directamente en los tubos del [place].   Si su medicamento es muy caro, por favor, pngase en contacto con landry rieger llamando al [phone] y presione la opcin 4 o envenos un mensaje a travs de Clinical cytogeneticist.   No podemos decirle cul ser su copago por los medicamentos por adelantado ya que esto es diferente dependiendo de la cobertura de su seguro. Sin embargo, es posible que podamos encontrar un medicamento sustituto a Audiological scientist un formulario para que el seguro cubra el medicamento que se  considera necesario.   Si se requiere una autorizacin previa para que su compaa de seguros malta su medicamento, por favor permtanos de 1 a 2 das hbiles para completar este proceso.  Los precios de los medicamentos varan con frecuencia dependiendo del Environmental consultant de dnde se surte la receta y alguna farmacias pueden ofrecer precios ms baratos.  El sitio web www.goodrx.com tiene cupones para medicamentos de Health and safety inspector. Los precios aqu no tienen en cuenta lo que podra costar con la ayuda del seguro (puede ser ms barato con su seguro), pero el sitio web puede darle el precio si no utiliz Tourist information centre manager.  -  Puede imprimir el cupn correspondiente y llevarlo con su receta a la farmacia.  - Tambin puede pasar por nuestra oficina durante el horario de atencin regular y Education officer, museum una tarjeta de cupones de GoodRx.  - Si necesita que su receta se enve electrnicamente a una farmacia diferente, informe a nuestra oficina a travs de MyChart de [place] o por telfono llamando al [phone] y presione la opcin 4.

## 2023-12-07 NOTE — Progress Notes (Signed)
   New Patient Visit   Subjective  Kelly Bennett is a 23 y.o. female who presents for the following: pt c/o acne bumps, not itchy, has been going on for one years, patient has tried numerous OTC skin care products, and skin has improved some, but still continues to breakout. Pt Nexplanon implant and doesn't have consistent periods.  The following portions of the chart were reviewed this encounter and updated as appropriate: medications, allergies, medical history  Review of Systems:  No other skin or systemic complaints except as noted in HPI or Assessment and Plan.  Objective  Well appearing patient in no apparent distress; mood and affect are within normal limits.   A focused examination was performed of the following areas: the face - Scattered open and closed comedones on the face with post inflammatory erythema. Scattered inflamed papules. Some scarring   Relevant exam findings are noted in the Assessment and Plan.    Assessment & Plan   Acne vulgaris - mild - inflammatory and comedonal with post inflammatory erythema/hyperpigmentation, some scarring  - Chronic and persistent condition with duration or expected duration over one year. Condition is symptomatic and bothersome to patient. Patient is flaring and not currently at treatment goal.  - Discussed various treatment options with patient, as well as need for consistent use for at least 6-12 weeks for full efficacy.  - Reviewed treatment options, including side effects of topical agents, oral antibiotics, OCPs (if female), oral spironolactone (if female), and isotretinoin. Discussed that isotretinoin is the most effective  - After discussion opted to initiate: start adapalene 0.3% cream QHS. Topical retinoid medications like tretinoin/Retin-A, adapalene/Differin, tazarotene/Fabior, and Epiduo/Epiduo Forte can cause dryness and irritation when first started. Only apply a pea-sized amount to the entire affected area. Avoid applying it  around the eyes, edges of mouth and creases at the nose. If you experience irritation, use a good moisturizer first and/or apply the medicine less often. If you are doing well with the medicine, you can increase how often you use it until you are applying every night. Be careful with sun protection while using this medication as it can make you sensitive to the sun. This medicine should not be used by pregnant women.  - Start Clindamycin gel to aa QD. - Recommend Elta MD sun protection products. - May continue Mirad moisturizer. - Recommended gentle cleanser    Return for acne follow up in 4-6 mths.  Kelly Bennett, CMA, am acting as scribe for Lauraine JAYSON Kanaris, MD .  Documentation: I have reviewed the above documentation for accuracy and completeness, and I agree with the above.  Lauraine JAYSON Kanaris, MD

## 2023-12-21 ENCOUNTER — Telehealth: Admitting: Psychiatry

## 2023-12-21 ENCOUNTER — Encounter: Payer: Self-pay | Admitting: Psychiatry

## 2023-12-21 DIAGNOSIS — F902 Attention-deficit hyperactivity disorder, combined type: Secondary | ICD-10-CM

## 2023-12-21 MED ORDER — LISDEXAMFETAMINE DIMESYLATE 50 MG PO CAPS: 50.0000 mg | ORAL_CAPSULE | Freq: Every morning | ORAL | 0 refills | Status: DC

## 2023-12-21 MED ORDER — AMPHETAMINE-DEXTROAMPHETAMINE 5 MG PO TABS: 5.0000 mg | ORAL_TABLET | Freq: Every day | ORAL | 0 refills | Status: AC | PRN

## 2023-12-21 NOTE — Progress Notes (Signed)
 Virtual Visit via Video Note  I connected with Kelly Bennett on 12/21/23 at 11:00 AM EDT by a video enabled telemedicine application and verified that I am speaking with the correct person using two identifiers.  Location Provider Location : [facility] Patient Location : Home  Participants: Patient , Provider   I discussed the limitations of evaluation and management by telemedicine and the availability of in person appointments. The patient expressed understanding and agreed to proceed.   I discussed the assessment and treatment plan with the patient. The patient was provided an opportunity to ask questions and all were answered. The patient agreed with the plan and demonstrated an understanding of the instructions.   The patient was advised to call back or seek an in-person evaluation if the symptoms worsen or if the condition fails to improve as anticipated.  BH MD OP Progress Note  12/21/2023 11:21 AM Kelly Bennett  MRN:  [id number]  Chief Complaint:  Chief Complaint  Patient presents with   Follow-up   Anxiety   Medication Refill   ADHD   Discussed the use of AI scribe software for clinical note transcription with the patient, who gave verbal consent to proceed.  History of Present Illness Kelly Bennett is a 23 year old Caucasian female, employed, engaged, lives in [place], has a history of ADHD was evaluated by telemedicine today for a follow-up appointment.  She reports doing well overall and does not express any current concerns. With a history of ADHD, she states she currently takes Vyvanse  50 mg daily. She notes that she previously experienced inconsistent effectiveness of her medication, particularly at work, with some days feeling as though the medication wore off early or was less effective. After realizing she was not eating in the morning when taking her medication, she began eating a protein with her dose and reports this resolved the issue.  She states she does not  currently take her 5 mg amphetamine /dextroamphetamine  tablets daily, but uses them as needed when studying, which is typically about 3 days per week. She anticipates needing this medication more regularly in November when she resumes studying.  She currently lives at home with her golden retriever puppy. Engaged and planning a wedding for April 25th of next year.    Visit Diagnosis:    ICD-10-CM   1. Attention deficit hyperactivity disorder (ADHD), combined type  F90.2 lisdexamfetamine  (VYVANSE ) 50 MG capsule    amphetamine -dextroamphetamine  (ADDERALL) 5 MG tablet      Past Psychiatric History: I have reviewed past psychiatric history from progress note on 11/21/2021.  I have reviewed notes per Dr. Barker-03/20/2022-patient had neuropsychological testing completed, meets criteria for ADHD combined type.  Past Medical History:  Past Medical History:  Diagnosis Date   ADD (attention deficit disorder)    Diagnosed age 25 - probably showed signs of not focusing, short attn span, easily distracted   Anxiety    Asthma    Frequent headaches    GERD (gastroesophageal reflux disease)    Renal stones    UTI (urinary tract infection)     Past Surgical History:  Procedure Laterality Date   BREAST REDUCTION SURGERY     WISDOM TOOTH EXTRACTION      Family Psychiatric History: I have reviewed family psychiatric history from progress note on 11/21/2021.  Family History:  Family History  Problem Relation Age of Onset   Arthritis Mother    Depression Mother    Hypertension Mother    Miscarriages / Stillbirths Mother    Arthritis Father  Hypertension Father    Prostate cancer Father    Arthritis Maternal Grandmother    Hypertension Maternal Grandmother    Ulcerative colitis Maternal Grandmother    Alcohol abuse Maternal Grandfather     Social History: I have reviewed social history from progress note on 11/21/2021. Social History   Socioeconomic History   Marital status: Single     Spouse name: Not on file   Number of children: Not on file   Years of education: Not on file   Highest education level: Some college, no degree  Occupational History   Occupation: bartender   Occupation: retail   Tobacco Use   Smoking status: Never   Smokeless tobacco: Never   Tobacco comments:    Vapes and has been for 1.5 years.   Vaping Use   Vaping status: Former   Substances: Nicotine, Flavoring   Devices: Stopped in May 2025  Substance and Sexual Activity   Alcohol use: Not Currently    Alcohol/week: 2.0 standard drinks of alcohol    Types: 2 Cans of beer per week   Drug use: Not Currently   Sexual activity: Yes    Partners: Male    Birth control/protection: Implant  Other Topics Concern   Not on file  Social History Narrative   Not on file   Social Drivers of Health   Financial Resource Strain: Low Risk  (08/26/2018)   Received from [facility]   Overall Financial Resource Strain (CARDIA)    Difficulty of Paying Living Expenses: Not hard at all  Food Insecurity: No Food Insecurity (08/26/2018)   Received from [facility]   Hunger Vital Sign    Within the past 12 months, you worried that your food would run out before you got the money to buy more.: Never true    Within the past 12 months, the food you bought just didn't last and you didn't have money to get more.: Never true  Transportation Needs: No Transportation Needs (08/26/2018)   Received from [facility] - Transportation    In the past 12 months, has lack of transportation kept you from medical appointments or from getting medications?: No    Lack of Transportation (Non-Medical): No  Physical Activity: Sufficiently Active (06/26/2020)   Received from [facility]   Exercise Vital Sign    On average, how many days per week do you engage in moderate to strenuous exercise (like a brisk walk)?: 4 days    On average, how many minutes  do you engage in exercise at this level?: 90 min  Stress: No Stress Concern Present (08/26/2018)   Received from [facility] - Occupational Stress Questionnaire    Feeling of Stress : Only a little  Social Connections: Moderately Integrated (08/26/2018)   Received from [facility]   Social Connection and Isolation Panel    In a typical week, how many times do you talk on the phone with family, friends, or neighbors?: More than three times a week    How often do you get together with friends or relatives?: More than three times a week    How often do you attend church or religious services?: More than 4 times per year    Do you belong to any clubs or organizations such as church groups, unions, fraternal or athletic groups, or school groups?: Yes    How often  do you attend meetings of the clubs or organizations you belong to?: More than 4 times per year    Are you married, widowed, divorced, separated, never married, or living with a partner?: Never married    Allergies: No Known Allergies  Metabolic Disorder Labs: Lab Results  Component Value Date   HGBA1C 5.3 09/27/2019   No results found for: PROLACTIN Lab Results  Component Value Date   CHOL 128 09/27/2019   TRIG 56.0 09/27/2019   HDL 60.80 09/27/2019   CHOLHDL 2 09/27/2019   VLDL 11.2 09/27/2019   LDLCALC 56 09/27/2019   Lab Results  Component Value Date   TSH 1.50 09/27/2019    Therapeutic Level Labs: No results found for: LITHIUM No results found for: VALPROATE No results found for: CBMZ  Current Medications: Current Outpatient Medications  Medication Sig Dispense Refill   Adapalene  0.3 % gel Apply 3-4 times weekly at night to dry skin after cleaning. Increase frequency up to nightly as tolerated. 59 g 5   albuterol (VENTOLIN HFA) 108 (90 Base) MCG/ACT inhaler Inhale into the lungs every 6 (six) hours as needed for wheezing or  shortness of breath.     amphetamine -dextroamphetamine  (ADDERALL) 5 MG tablet Take 1 tablet (5 mg total) by mouth daily as needed. Take daily as needed at 4 PM 30 tablet 0   clindamycin  (CLINDAGEL) 1 % gel Apply topically 2 (two) times daily. Apply to the affected area of skin daily 60 g 5   etonogestrel (NEXPLANON) 68 MG IMPL implant Inject into the skin.     lisdexamfetamine  (VYVANSE ) 50 MG capsule Take 1 capsule (50 mg total) by mouth in the morning. 90 capsule 0   meloxicam  (MOBIC ) 15 MG tablet Take 15 mg by mouth daily. As needed     omeprazole  (PRILOSEC) 20 MG capsule Take 1 capsule (20 mg total) by mouth daily. 30 capsule 2   No current facility-administered medications for this visit.     Musculoskeletal: Strength & Muscle Tone: UTA Gait & Station: Seated Patient leans: N/A  Psychiatric Specialty Exam: Review of Systems  Psychiatric/Behavioral: Negative.      There were no vitals taken for this visit.There is no height or weight on file to calculate BMI.  General Appearance: Casual  Eye Contact:  Fair  Speech:  Normal Rate  Volume:  Normal  Mood:  Euthymic  Affect:  Congruent  Thought Process:  Goal Directed and Descriptions of Associations: Intact  Orientation:  Full (Time, Place, and Person)  Thought Content: Logical   Suicidal Thoughts:  No  Homicidal Thoughts:  No  Memory:  Immediate;   Fair Recent;   Fair Remote;   Fair  Judgement:  Fair  Insight:  Fair  Psychomotor Activity:  Normal  Concentration:  Concentration: Fair and Attention Span: Fair  Recall:  [employer] of Knowledge: Fair  Language: Fair  Akathisia:  No  Handed:  Right  AIMS (if indicated): not done  Assets:  Desire for Improvement Housing Social Support Transportation  ADL's:  Intact  Cognition: WNL  Sleep:  Fair   Screenings: GAD-7    Garment/textile Technologist Visit from 06/09/2023 in [facility] Office Visit from 11/13/2022 in [facility] Visit from 06/29/2022 in [facility] Office Visit from 05/05/2022 in [facility] Office Visit from 11/21/2021 in [facility]  Total GAD-7 Score 1 1 2  1 5   PHQ2-9    Flowsheet Row Office Visit from 06/09/2023 in [facility] Office Visit from 11/13/2022 in [facility] Office Visit from 06/29/2022 in [facility] Office Visit from 05/05/2022 in [facility] Office Visit from 11/21/2021 in [facility]  PHQ-2 Total Score 0 0 0 1 1  PHQ-9 Total Score -- -- 3 2 --   Flowsheet Row Video Visit from 12/21/2023 in [facility] Office Visit from 08/11/2023 in [facility] Office Visit from 06/09/2023 in [facility]  C-SSRS RISK CATEGORY No Risk No Risk No Risk     Assessment and Plan: Kelly Bennett is a 23 year old Caucasian female who presented for a follow-up appointment.  Discussed assessment and plan as noted below.  1. Attention deficit hyperactivity disorder (ADHD), combined type-stable Currently denies any significant concerns compliant on her stimulants at and denies any side effects. Continue Vyvanse  50 mg daily Continue Adderall 5 mg as needed uses it few times a week only. Reviewed [place] PMP AWARxE UDS dated 08/13/2023-positive for amphetamines negative for all other drugs tested.  Follow-up Follow-up in clinic in 3 to 4 months or sooner in person.    Consent: Patient/Guardian gives verbal consent for treatment and assignment of benefits for services provided during this visit. Patient/Guardian expressed understanding and agreed  to proceed.   This note was generated in part or whole with voice recognition software. Voice recognition is usually quite accurate but there are transcription errors that can and very often do occur. I apologize for any typographical errors that were not detected and corrected.    Kelly Carbonell, MD 12/22/2023, 12:41 PM

## 2024-02-22 ENCOUNTER — Other Ambulatory Visit: Payer: Self-pay | Admitting: Physician Assistant

## 2024-02-22 ENCOUNTER — Telehealth: Payer: Self-pay

## 2024-02-22 DIAGNOSIS — F902 Attention-deficit hyperactivity disorder, combined type: Secondary | ICD-10-CM

## 2024-02-22 NOTE — Telephone Encounter (Signed)
 Pt asking for a refill of her lisdexamfetamine  (VYVANSE ) 50 MG capsule sent to [facility] [address], [place] - [address]  Last apt:12/21/23 Next on 04/04/24

## 2024-02-23 MED ORDER — LISDEXAMFETAMINE DIMESYLATE 50 MG PO CAPS: 50.0000 mg | ORAL_CAPSULE | Freq: Every morning | ORAL | 0 refills | Status: DC

## 2024-02-23 NOTE — Addendum Note (Signed)
 Addended byBETHA COBY HEIGHT on: 02/23/2024 10:30 AM   Modules accepted: Orders

## 2024-02-23 NOTE — Telephone Encounter (Signed)
 I have sent Vyvanse  50 mg as requested to pharmacy with date specified.  Patient is not due until mid January.

## 2024-03-07 ENCOUNTER — Ambulatory Visit: Admitting: Family Medicine

## 2024-03-07 VITALS — BP 115/96 | HR 89 | Resp 16 | Ht 67.0 in | Wt 194.6 lb

## 2024-03-07 DIAGNOSIS — J4520 Mild intermittent asthma, uncomplicated: Secondary | ICD-10-CM | POA: Diagnosis not present

## 2024-03-07 DIAGNOSIS — F902 Attention-deficit hyperactivity disorder, combined type: Secondary | ICD-10-CM

## 2024-03-07 DIAGNOSIS — F5101 Primary insomnia: Secondary | ICD-10-CM | POA: Insufficient documentation

## 2024-03-07 NOTE — Progress Notes (Signed)
 "  New Patient Office Visit  Patient ID: Kelly Bennett, Female   DOB: [birth year] 23 y.o. MRN: [id number]  Chief Complaint  Patient presents with   Establish Care    New patient establishing care. Previous PCP: [facility] in [place] Pap Smear: Never   Contraception    Patient would like to see what options she has.   Subjective:     Kelly Bennett presents to establish care  HPI  Discussed the use of AI scribe software for clinical note transcription with the patient, who gave verbal consent to proceed.  History of Present Illness Kelly Bennett is a 24 year old female who presents for Nexplanon removal and elevated blood pressure. She is accompanied by her mother.  She seeks removal of her Nexplanon implant due to irregular and unpredictable menstrual cycles after the first two years of use, with periods sometimes lasting a month or just two days. She experienced significant weight gain of 45 pounds in the first year of use. She is not currently using it for contraceptive purposes as she is planning to get married in April.  She has a history of ADHD and is currently taking Vyvanse , typically Monday through Friday, and occasionally Adderall 5 mg for additional focus in the evenings. She prefers Vyvanse  over Adderall due to fewer side effects. She follows with a behavioral health specialist and last visited her on October 21st. She experiences heart racing and anxiety, which she attributes to her ADHD medication.  She reports issues with sleep, describing difficulty falling asleep and being a light sleeper, often waking up when her puppy moves. She typically goes to bed between 11:30 PM and 12:30 AM and wakes up around 7:00 to 7:30 AM. She has tried melatonin in the past without significant improvement and has not tried other sleep aids recently. A family history of dementia influences her consideration of sleep aids.  She has GERD and takes omeprazole  20 mg daily, although it does not significantly  alleviate her symptoms. She experiences reflux symptoms, including heartburn, and has been informed by her dentist of acid erosion on her molars. She also reports frequent urgent bowel movements and gas pain. She has had a workup for celiac disease, which was negative, and a metabolic panel, which was normal.  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   Outpatient Encounter Medications as of 03/07/2024  Medication Sig   Adapalene  0.3 % gel Apply 3-4 times weekly at night to dry skin after cleaning. Increase frequency up to nightly as tolerated.   albuterol (VENTOLIN HFA) 108 (90 Base) MCG/ACT inhaler Inhale into the lungs every 6 (six) hours as needed for wheezing or shortness of breath.   amphetamine -dextroamphetamine  (ADDERALL) 5 MG tablet Take 1 tablet (5 mg total) by mouth daily as needed. Take daily as needed at 4 PM   clindamycin  (CLINDAGEL) 1 % gel Apply topically 2 (two) times daily. Apply to the affected area of skin daily   etonogestrel (NEXPLANON) 68 MG IMPL implant Inject into the skin.   [START ON 03/20/2024] lisdexamfetamine  (VYVANSE ) 50 MG capsule Take 1 capsule (50 mg total) by mouth in the morning.   meloxicam  (MOBIC ) 15 MG tablet Take 15 mg by mouth daily. As needed   omeprazole  (PRILOSEC) 20 MG capsule Take 1 capsule (20 mg total) by mouth daily. Needs appointment for future refills   No facility-administered encounter medications on file as of 03/07/2024.    Past Medical History:  Diagnosis Date   ADD (attention  deficit disorder)    Diagnosed age 42 - probably showed signs of not focusing, short attn span, easily distracted   Anxiety    Asthma    Frequent headaches    GERD (gastroesophageal reflux disease)    Renal stones    UTI (urinary tract infection)     Past Surgical History:  Procedure Laterality Date   BREAST REDUCTION SURGERY     BREAST SURGERY     WISDOM TOOTH EXTRACTION      Family History  Problem Relation Age of Onset   Arthritis  Mother    Depression Mother    Hypertension Mother    Miscarriages / Stillbirths Mother    Arthritis Father    Hypertension Father    Prostate cancer Father    Arthritis Maternal Grandmother    Hypertension Maternal Grandmother    Ulcerative colitis Maternal Grandmother    Alcohol abuse Maternal Grandfather    COPD Paternal Grandmother    Dementia Paternal Grandmother    Hypertension Paternal Grandfather    Dementia Paternal Grandfather    Stroke Paternal Grandfather     Social History   Socioeconomic History   Marital status: Single    Spouse name: Not on file   Number of children: Not on file   Years of education: Not on file   Highest education level: 12th grade  Occupational History   Occupation: bartender   Occupation: retail   Tobacco Use   Smoking status: Former    Current packs/day: 0.00    Average packs/day: 0.3 packs/day for 2.0 years (0.5 ttl pk-yrs)    Types: E-cigarettes, Cigarettes    Quit date: 07/01/2023    Years since quitting: 0.6   Smokeless tobacco: Never   Tobacco comments:    Vapes and has been for 1.5 years.   Vaping Use   Vaping status: Former   Substances: Nicotine, Flavoring   Devices: Stopped in May 2025  Substance and Sexual Activity   Alcohol use: Yes    Alcohol/week: 2.0 standard drinks of alcohol    Types: 2 Glasses of wine per week   Drug use: Never   Sexual activity: Not Currently    Partners: Male    Birth control/protection: Implant  Other Topics Concern   Not on file  Social History Narrative   Not on file   Social Drivers of Health   Tobacco Use: Medium Risk (03/07/2024)   Patient History    Smoking Tobacco Use: Former    Smokeless Tobacco Use: Never    Passive Exposure: Not on file  Financial Resource Strain: Low Risk (03/07/2024)   Overall Financial Resource Strain (CARDIA)    Difficulty of Paying Living Expenses: Not hard at all  Recent Concern: Financial Resource Strain - Medium Risk (03/07/2024)   Overall Financial  Resource Strain (CARDIA)    Difficulty of Paying Living Expenses: Somewhat hard  Food Insecurity: No Food Insecurity (03/07/2024)   Epic    Worried About Programme Researcher, Broadcasting/film/video in the Last Year: Never true    Ran Out of Food in the Last Year: Never true  Recent Concern: Food Insecurity - Food Insecurity Present (03/07/2024)   Epic    Worried About Programme Researcher, Broadcasting/film/video in the Last Year: Sometimes true    Ran Out of Food in the Last Year: Never true  Transportation Needs: No Transportation Needs (03/07/2024)   Epic    Lack of Transportation (Medical): No    Lack of Transportation (Non-Medical): No  Physical  Activity: Insufficiently Active (03/07/2024)   Exercise Vital Sign    Days of Exercise per Week: 3 days    Minutes of Exercise per Session: 10 min  Stress: Stress Concern Present (03/07/2024)   [employer] of Occupational Health - Occupational Stress Questionnaire    Feeling of Stress: To some extent  Social Connections: Moderately Integrated (03/07/2024)   Social Connection and Isolation Panel    Frequency of Communication with Friends and Family: More than three times a week    Frequency of Social Gatherings with Friends and Family: More than three times a week    Attends Religious Services: More than 4 times per year    Active Member of Clubs or Organizations: No    Attends Banker Meetings: Not on file    Marital Status: Living with partner  Intimate Partner Violence: Not At Risk (03/07/2024)   Epic    Fear of Current or Ex-Partner: No    Emotionally Abused: No    Physically Abused: No    Sexually Abused: No  Depression (PHQ2-9): Medium Risk (03/07/2024)   Depression (PHQ2-9)    PHQ-2 Score: 9  Alcohol Screen: Low Risk (03/07/2024)   Alcohol Screen    Last Alcohol Screening Score (AUDIT): 2  Housing: Low Risk (03/07/2024)   Epic    Unable to Pay for Housing in the Last Year: No    Number of Times Moved in the Last Year: 0    Homeless in the Last Year: No  Utilities: Not  At Risk (03/07/2024)   Epic    Threatened with loss of utilities: No  Health Literacy: Adequate Health Literacy (03/07/2024)   B1300 Health Literacy    Frequency of need for help with medical instructions: Never    ROS    Objective:    BP (!) 115/96 (BP Location: Right Arm, Patient Position: Sitting, Cuff Size: Large)   Pulse 89   Resp 16   Ht 5' 7 (1.702 m)   Wt 194 lb 9.6 oz (88.3 kg)   SpO2 100%   BMI 30.48 kg/m   Physical Exam Vitals reviewed.  Constitutional:      General: She is not in acute distress.    Appearance: Normal appearance. She is not ill-appearing.  Neck:     Thyroid : No thyroid  mass, thyromegaly or thyroid  tenderness.  Cardiovascular:     Rate and Rhythm: Normal rate and regular rhythm.  Pulmonary:     Effort: Pulmonary effort is normal. No respiratory distress.     Breath sounds: No wheezing, rhonchi or rales.  Neurological:     Mental Status: She is alert and oriented to person, place, and time.  Psychiatric:        Mood and Affect: Mood normal.        Behavior: Behavior normal.      Last CBC Lab Results  Component Value Date   WBC 4.9 10/05/2023   HGB 15.3 (H) 10/05/2023   HCT 45.2 10/05/2023   MCV 88.0 10/05/2023   MCH 28.9 04/15/2022   RDW 12.6 10/05/2023   PLT 284.0 10/05/2023   Last metabolic panel Lab Results  Component Value Date   GLUCOSE 89 10/05/2023   NA 138 10/05/2023   K 4.3 10/05/2023   CL 103 10/05/2023   CO2 28 10/05/2023   BUN 12 10/05/2023   CREATININE 0.59 10/05/2023   GFR 125.54 10/05/2023   CALCIUM 9.4 10/05/2023   PROT 7.8 10/05/2023   ALBUMIN 4.6 10/05/2023   BILITOT  0.9 10/05/2023   ALKPHOS 58 10/05/2023   AST 12 10/05/2023   ALT 15 10/05/2023   ANIONGAP 8 04/15/2022    Assessment & Plan:   Problem List Items Addressed This Visit     Attention deficit hyperactivity disorder (ADHD), combined type   Mild intermittent asthma, uncomplicated - Primary   Primary insomnia    Assessment and  Plan Assessment & Plan Contraceptive management and Nexplanon removal Nexplanon is no longer desired for contraception due to upcoming marriage and hormonal side effects, including irregular periods and weight gain. She is considering non-hormonal options for contraception and period regulation. - Scheduled procedure for Nexplanon removal. - Discussed non-hormonal contraceptive options, including ParaGard and ovulation tracking. - Will consider hormone-free vaginal gel (Fexy) for contraception.  Attention-deficit hyperactivity disorder, combined type, Chronic  Currently managed with Vyvanse  50 mg daily for work and Adderall as needed for additional focus. Prefers Vyvanse  over Adderall due to better efficacy and fewer side effects. - Continue Vyvanse  50 mg daily. - Use Adderall as needed for additional focus.  Primary insomnia, Chronic  Difficulty falling asleep, possibly related to ADHD medication timing. Sleep is interrupted by a large puppy sleeping in the bed. Previous use of melatonin was ineffective. Concerns about potential cognitive effects of sleep aids due to family history of dementia. - Implement sleep hygiene measures, including reducing screen time before bed and maintaining a consistent sleep schedule. - Consider melatonin at a low dose (1-5 mg) before bed. - Avoid trazodone and Atarax due to family history of dementia per pt preference   Gastroesophageal reflux disease Chronic  GERD symptoms include heartburn and dental erosion. Currently managed with omeprazole  20 mg as needed, but reports limited efficacy. Previous GI evaluation ruled out celiac disease. Symptoms include upper and lower GI issues, gas pain, and urgency in bowel movements. - Will consider increasing omeprazole  to 40 mg daily. - Will follow up with gastroenterology for further evaluation and management.  General health maintenance Due for cervical cancer screening. No previous Pap smear performed. - Will  schedule Pap smear during next visit.    Return in about 6 weeks (around 04/18/2024) for nexplanon removal  and pap smear procedure visit ( ).   Rockie Agent, MD [facility]     "

## 2024-03-07 NOTE — Patient Instructions (Signed)
 To keep you healthy, please keep in mind the following health maintenance items that you are due for:   Health Maintenance Due  Topic Date Due   COVID-19 Vaccine (1) Never done   Pneumococcal Vaccine (1 of 1 - PPSV23, PCV20, or PCV21) 08/28/2004   Cervical Cancer Screening (Pap smear)  Never done     Best Wishes,   Dr. Lang

## 2024-03-09 ENCOUNTER — Telehealth: Payer: Self-pay

## 2024-03-09 NOTE — Telephone Encounter (Signed)
 Patient called asking if a keloid could be removed at her office visit here 03/20/24, discussed with patient Dr Raymund would have to see the keloid, most keloids are removed during a surgery excision appt

## 2024-03-20 ENCOUNTER — Ambulatory Visit

## 2024-03-22 ENCOUNTER — Ambulatory Visit: Admitting: Nurse Practitioner

## 2024-03-22 ENCOUNTER — Other Ambulatory Visit: Payer: Self-pay

## 2024-03-22 ENCOUNTER — Ambulatory Visit

## 2024-03-22 ENCOUNTER — Ambulatory Visit: Payer: Self-pay

## 2024-03-22 ENCOUNTER — Other Ambulatory Visit (HOSPITAL_COMMUNITY)
Admission: RE | Admit: 2024-03-22 | Discharge: 2024-03-22 | Disposition: A | Source: Ambulatory Visit | Attending: Nurse Practitioner | Admitting: Nurse Practitioner

## 2024-03-22 VITALS — BP 104/70 | HR 71 | Temp 98.1°F | Ht 67.0 in | Wt 195.0 lb

## 2024-03-22 DIAGNOSIS — N939 Abnormal uterine and vaginal bleeding, unspecified: Secondary | ICD-10-CM | POA: Insufficient documentation

## 2024-03-22 DIAGNOSIS — L700 Acne vulgaris: Secondary | ICD-10-CM

## 2024-03-22 DIAGNOSIS — L910 Hypertrophic scar: Secondary | ICD-10-CM

## 2024-03-22 LAB — POCT URINE PREGNANCY: Preg Test, Ur: NEGATIVE

## 2024-03-22 MED ORDER — DOXYCYCLINE MONOHYDRATE 100 MG PO TABS: 100.0000 mg | ORAL_TABLET | Freq: Every day | ORAL | 2 refills | Status: AC

## 2024-03-22 MED ORDER — DOXYCYCLINE MONOHYDRATE 100 MG PO TABS: 100.0000 mg | ORAL_TABLET | Freq: Every day | ORAL | 2 refills | Status: DC

## 2024-03-22 MED ORDER — CLINDAMYCIN PHOS-BENZOYL PEROX 1-5 % EX GEL: Freq: Two times a day (BID) | CUTANEOUS | 5 refills | Status: AC

## 2024-03-22 MED ORDER — SPIRONOLACTONE 50 MG PO TABS: 100.0000 mg | ORAL_TABLET | Freq: Every day | ORAL | 2 refills | Status: DC

## 2024-03-22 MED ORDER — SPIRONOLACTONE 50 MG PO TABS: 100.0000 mg | ORAL_TABLET | Freq: Every day | ORAL | 2 refills | Status: AC

## 2024-03-22 NOTE — Telephone Encounter (Signed)
 FYI Only or Action Required?: FYI only for provider: appointment scheduled on 1/21.  Patient was last seen in primary care on 03/07/2024 by Sharma Coyer, MD.  Called Nurse Triage reporting Menstrual Problem.  Symptoms began yesterday.  Interventions attempted: Nothing.  Symptoms are: gradually worsening.  Triage Disposition: See HCP Within 4 Hours (Or PCP Triage)  Patient/caregiver understands and will follow disposition?: Yes   Reason for Triage: Irregular menstrual flow, spotting, unusually heavy flow today  Reason for Disposition  MODERATE vaginal bleeding (e.g., soaking 1 pad or tampon per hour and present > 6 hours; 1 menstrual cup every 6 hours)  Answer Assessment - Initial Assessment Questions 1. BLEEDING SEVERITY: Describe the bleeding that you are having. How much bleeding is there?      Light bloody clotting yesterday; bleeding heavily this morning through a tampon within an hour and a half which is abnormal for me.   2. ONSET: When did the bleeding begin? Is it continuing now?     Yesterday   3. MENSTRUAL PERIOD: When was the last normal menstrual period? How is this different than your period?     1/5-1/12  4. REGULARITY: How regular are your periods?     Irregular  5. ABDOMEN PAIN: Do you have any pain? How bad is the pain?  (e.g., Scale 0-10; none, mild, moderate, or severe)     Discomfort and feeling off     8. HORMONE MEDICINES: Are you taking any hormone medicines, prescription or over-the-counter? (e.g., birth control pills, estrogen)     Birth control   10. CAUSE: What do you think is causing the bleeding? (e.g., recent gyn surgery, recent gyn procedure; known bleeding disorder, cervical cancer, polycystic ovarian disease, fibroids)     On birth control which has made her cycles irregular  11. HEMODYNAMIC STATUS: Are you weak or feeling lightheaded? If Yes, ask: Can you stand and walk normally?        A  little bit, a little bit weak   12. OTHER SYMPTOMS: What other symptoms are you having with the bleeding? (e.g., passed tissue, vaginal discharge, fever, menstrual-type cramps)       Diarrhea  Protocols used: Vaginal Bleeding - Abnormal-A-AH

## 2024-03-22 NOTE — Patient Instructions (Signed)

## 2024-03-22 NOTE — Progress Notes (Signed)
 "  Established Patient Office Visit  Subjective   Patient ID: Heiley Shaikh, female    DOB: 09-03-2000  Age: 24 y.o. MRN: [id number]  Chief Complaint  Patient presents with   Menstrual Problem    Pt complains of irregular menstrual bleeding. Starting 1/5-1/12, yesterday pt had some clotting and dark blood. Dull headache and foggy brain. Today pt was bleeding again and bled thru a tampon in 90 minutes. Pt states of lightheadedness and dull cramps.     HPI  Discussed the use of AI scribe software for clinical note transcription with the patient, who gave verbal consent to proceed.  History of Present Illness Alahna Dunne is a 24 year old female who presents with abnormal uterine bleeding. She is accompanied by her mother.  She has been experiencing an abnormally prolonged menstrual period starting at the beginning of January, lasting from January 5th to January 12th, with uncertainty if it began earlier. Her periods are typically irregular due to Nexplanon use, but this duration is unusually long for her. On January 20th, she began experiencing very dark clotting, which she sometimes experiences, but this time the volume was noticeably more than usual. On the morning of January 21st, she experienced heavy bleeding, going through a tampon in about an hour and a half, with a mixture of bright red and darker blood. The bleeding seemed to slow down slightly before her visit.  She reports mild cramping that is not persistent and not as severe as typical menstrual cramps. She also experienced a mild headache and general malaise on January 20th, with mild nausea that she associates with headaches. No abnormal vaginal discharge, odors, fever, chills, sore throat, ear pain, shortness of breath, or notable fatigue reported by the patient.  She is currently taking Vyvanse  50 mg daily, which she resumed after misplacing it and using Adderall temporarily. She has not taken meloxicam  recently and has not been  able to take omeprazole  for about a month due to pharmacy issues. She was prescribed spironolactone  by her dermatologist but has not started it yet.  She denies recent unprotected sexual intercourse, is in a monogamous relationship, and considers the likelihood of pregnancy or STI to be low. She has a history of migraines but has not been officially diagnosed or treated for them. Her last bowel movement was on the morning of January 21st, which was unusually loose.     Review of Systems  Constitutional:  Negative for chills and fever.  Respiratory:  Negative for shortness of breath.   Cardiovascular:  Negative for chest pain.  Neurological:  Positive for headaches. Negative for dizziness.  Psychiatric/Behavioral:  Negative for hallucinations and suicidal ideas.       Objective:     BP 104/70   Pulse 71   Temp 98.1 F (36.7 C) (Oral)   Ht 5' 7 (1.702 m)   Wt 195 lb (88.5 kg)   SpO2 99%   BMI 30.54 kg/m  BP Readings from Last 3 Encounters:  03/22/24 104/70  03/07/24 (!) 115/96  10/05/23 104/68   Wt Readings from Last 3 Encounters:  03/22/24 195 lb (88.5 kg)  03/07/24 194 lb 9.6 oz (88.3 kg)  10/05/23 181 lb 4 oz (82.2 kg)   SpO2 Readings from Last 3 Encounters:  03/22/24 99%  03/07/24 100%  04/15/22 97%      Physical Exam Vitals and nursing note reviewed.  Constitutional:      Appearance: Normal appearance.  Cardiovascular:     Rate and Rhythm:  Normal rate and regular rhythm.     Heart sounds: Normal heart sounds.  Pulmonary:     Effort: Pulmonary effort is normal.     Breath sounds: Normal breath sounds.  Abdominal:     General: Bowel sounds are normal. There is no distension.     Palpations: There is no mass.     Tenderness: There is no abdominal tenderness.     Hernia: No hernia is present.  Neurological:     Mental Status: She is alert.      Results for orders placed or performed in visit on 03/22/24  POCT urine pregnancy  Result Value Ref Range    Preg Test, Ur Negative Negative      The ASCVD Risk score (Arnett DK, et al., 2019) failed to calculate for the following reasons:   The 2019 ASCVD risk score is only valid for ages 9 to 38   * - Cholesterol units were assumed    Assessment & Plan:   Problem List Items Addressed This Visit   None Visit Diagnoses       Abnormal vaginal bleeding    -  Primary   Relevant Orders   CBC   POCT urine pregnancy (Completed)   Urine cytology ancillary only     Vaginal bleeding          Assessment and Plan Assessment & Plan Abnormal uterine bleeding Prolonged bleeding with increased volume and clotting. Differential includes hormonal irregularity, fibroids, or cysts. Low likelihood of pregnancy, STI, or cervical cancer. No severe anemia or acute distress. - Performed urine pregnancy test. - Conducted urine STI screening for trichomonas, gonorrhea, and chlamydia. [place] Certain PCP follow-up on February 5th for further evaluation and potential pelvic exam. - Instructed to return if symptoms worsen. -after joint discussion deferred pelvic exam as she has not had one and has appt with PCP   Contraceptive management (Nexplanon) Nexplanon in place since April 2023, nearing end of life. Potential hormonal irregularity contributing to abnormal bleeding. - Continue with scheduled Nexplanon removal and replacement on February 5th.   Return if symptoms worsen or fail to improve.    Adina Crandall, NP  "

## 2024-03-22 NOTE — Progress Notes (Signed)
 "   Subjective   Kelly Bennett is a 24 y.o. female who presents for the following: Lesion(s) of concern  and Follow up of acne. Patient is established patient   Today patient reports: Some improvement in acne but not where she would like it to be; she is using Adapalene  0.3% gel 3-4 nights weekly.   Getting married in April  LOC on left ear  Review of Systems:    No other skin or systemic complaints except as noted in HPI or Assessment and Plan.  The following portions of the chart were reviewed this encounter and updated as appropriate: medications, allergies, medical history  Relevant Medical History:  n/a   Objective  (SKPE) Well appearing patient in no apparent distress; mood and affect are within normal limits. Examination was performed of the: Focused Exam of: face and left ear   Examination notable for: Acne vulgaris: Scattered open and closed comedones on the face. Red, inflammatory papules and pustules on face  Keloid on the left superior helix  Examination limited by: Clothing and Patient deferred removal       Assessment & Plan  (SKAP)   Acne vulgaris - moderate, comedonal, and inflammatory - Chronic and persistent condition with duration or expected duration over one year. Condition is symptomatic and bothersome to patient. Patient is flaring and not currently at treatment goal.  - Discussed various treatment options with patient, as well as need for consistent use for at least 6-12 weeks for full efficacy.  - Reviewed treatment options, including side effects of topical agents, oral antibiotics, OCPs (if female), oral spironolactone  (if female), and isotretinoin. Discussed that isotretinoin is the most effective  - Getting married in April  - Stop clinda lotion and start benzoyl peroxide 5% - clindamycin  1% gel in the morning. Educated patient about proper use and potential side effects, including dryness, irritation, and bleaching of fabrics. - Continue adapalene  0.3%  gel in the evening. Educated patient about proper use and potential side effects, including dryness, irritation, sun sensitivity, and transient worsening of acne. - Start doxycycline  100mg  by mouth daily x3 months  - Discussed side effects and precautions with doxycycline  including taking with meal, waiting at least 30 minutes before lying down at night, increased sun sensitivity, and to stop medication if becomes pregnant or breastfeeding - Start spironolactone  50 mg PO daily x1 week and increase to 100mg  daily as tolerated  - Discussed risk of benefits of this medication as well as common side effects. Explained that the most common side effect is polyuria which is why we recommend she take the medication in the morning. Other side effects include dizziness, breast tenderness/enlargement (which usually resolves after 1 month), and headaches. Also informed the patient that there is a chance this medication could increase her potassium and we will require occasional blood tests.  - Discussed pregnancy category X and patient should discontinue the medication if trying to conceive or if becomes pregnant. - Screened for renal and cardiac dz and personal and family history of breast cancer - No need to screen for underlying hyperkalemia except if >62 years old, h/o renal or cardiac disease, underlying hepatic function, high doses (>200mg /day), or on ACEi's/NSAIDs/Bactrim - After the full discussion patient opted to proceed to treat her acne with spironolactone . - CMP from 10/2023 reviewed and wnl   Keloid of the left superior helix  - Discussed treatment options including topical and intralesional therapies as well as excision. After discussion of risks/benefits/side effects/alternatives, patient elected to  proceed with ILK injection today.  - will RTC for shave removal followed by ILK, advised will need serial ILK q4-6 wks   Was sun protection counseling provided?: Yes   Level of service outlined above    Patient instructions (SKPI)   Procedures, orders, diagnosis for this visit:  ACNE VULGARIS   This Visit - clindamycin -benzoyl peroxide (BENZACLIN) gel - Apply topically 2 (two) times daily. Apply to the affected area of skin once daily - spironolactone  (ALDACTONE ) 50 MG tablet - Take 2 tablets (100 mg total) by mouth daily. Take 50mg  daily x1 week and increase to 100mg  daily as tolerated - doxycycline  (ADOXA) 100 MG tablet - Take 1 tablet (100 mg total) by mouth daily. Take 100 mg twice daily for 3 months  Acne vulgaris -     Clindamycin  Phos-Benzoyl Perox; Apply topically 2 (two) times daily. Apply to the affected area of skin once daily  Dispense: 50 g; Refill: 5 -     Spironolactone ; Take 2 tablets (100 mg total) by mouth daily. Take 50mg  daily x1 week and increase to 100mg  daily as tolerated  Dispense: 60 tablet; Refill: 2 -     Doxycycline  Monohydrate; Take 1 tablet (100 mg total) by mouth daily. Take 100 mg twice daily for 3 months  Dispense: 30 tablet; Refill: 2    Return to clinic: Return for Keloid removal left ear.     Documentation: I have reviewed the above documentation for accuracy and completeness, and I agree with the above.  Lauraine JAYSON Kanaris, MD  "

## 2024-03-22 NOTE — Telephone Encounter (Signed)
 I have reviewed patient call and agree with recommendations

## 2024-03-22 NOTE — Patient Instructions (Signed)
 Nice to see you today I will be in touch with the labs once I have them Follow up if you do not improve

## 2024-03-23 LAB — CBC
HCT: 42 % (ref 36.0–46.0)
Hemoglobin: 14.3 g/dL (ref 12.0–15.0)
MCHC: 34 g/dL (ref 30.0–36.0)
MCV: 86.1 fl (ref 78.0–100.0)
Platelets: 291 K/uL (ref 150.0–400.0)
RBC: 4.87 Mil/uL (ref 3.87–5.11)
RDW: 12.4 % (ref 11.5–15.5)
WBC: 6.6 K/uL (ref 4.0–10.5)

## 2024-03-23 LAB — URINE CYTOLOGY ANCILLARY ONLY
Chlamydia: NEGATIVE
Comment: NEGATIVE
Comment: NEGATIVE
Comment: NORMAL
Neisseria Gonorrhea: NEGATIVE
Trichomonas: NEGATIVE

## 2024-03-24 ENCOUNTER — Ambulatory Visit: Payer: Self-pay | Admitting: Nurse Practitioner

## 2024-04-03 ENCOUNTER — Ambulatory Visit

## 2024-04-04 ENCOUNTER — Ambulatory Visit: Admitting: Psychiatry

## 2024-04-04 ENCOUNTER — Encounter: Payer: Self-pay | Admitting: Psychiatry

## 2024-04-04 ENCOUNTER — Other Ambulatory Visit: Payer: Self-pay

## 2024-04-04 VITALS — BP 129/93 | HR 87 | Temp 97.6°F | Ht 67.0 in | Wt 193.0 lb

## 2024-04-04 DIAGNOSIS — F902 Attention-deficit hyperactivity disorder, combined type: Secondary | ICD-10-CM

## 2024-04-04 MED ORDER — LISDEXAMFETAMINE DIMESYLATE 50 MG PO CAPS: 50.0000 mg | ORAL_CAPSULE | Freq: Every morning | ORAL | 0 refills | Status: AC

## 2024-04-04 NOTE — Progress Notes (Unsigned)
 [facility] MD OP Progress Note  04/05/2024 10:10 AM Kelly Bennett  MRN:  [id number]  Chief Complaint:  Chief Complaint  Patient presents with   Follow-up   ADD   Medication Refill   Discussed the use of AI scribe software for clinical note transcription with the patient, who gave verbal consent to proceed.  History of Present Illness Kelly Bennett is a 24 year old Caucasian female, employed, engaged, lives in [place], has a history of ADHD was evaluated in office today for a follow-up appointment.  She reports significant stress related to planning her upcoming wedding, particularly after her photographer went out of business two months before the event, which contributes to her current experience. She reports feeling anxious and identifies circumstantial factors associated with the wedding as the primary cause.  She continues to focus on ongoing management of attention and focus symptoms. She describes a recent episode in which she went approximately 1 month without taking her Vyvanse  50 mg because she misplaced her medication and had issues with pharmacy refills. After she resumed Vyvanse , she notes a marked improvement in productivity and focus, stating she was able to complete many tasks that had previously been difficult. She currently takes Vyvanse  50 mg and feels satisfied with the dosage, noting that the medication is especially helpful for maintaining productivity. During the period when she could not find her Vyvanse , she reports having used Adderall 5 mg as needed but found Adderall less effective for her. She is not currently using Adderall as she is not studying at this time and does not anticipate needing a refill.  She denies any thoughts of hurting herself or others.  She denies any perceptual disturbances.  She reports appetite is fair.  She may have gained some weight in the past months.  Meal preps with fianc.  He is hopeful that might help.  Regularly plays with puppy and takes him for  walks, typically 30-45 minutes daily. Not attending gym currently due to time constraints.   Visit Diagnosis:    ICD-10-CM   1. Attention deficit hyperactivity disorder (ADHD), combined type  F90.2 lisdexamfetamine  (VYVANSE ) 50 MG capsule      Past Psychiatric History: I have reviewed past psychiatric history from progress note on 11/21/2021.  I have reviewed notes by Dr. Barker-03/20/2022-patient had neuropsychological testing completed, meets criteria for ADHD combined type.  Past Medical History:  Past Medical History:  Diagnosis Date   ADD (attention deficit disorder)    Diagnosed age 44 - probably showed signs of not focusing, short attn span, easily distracted   Anxiety    Asthma    Frequent headaches    GERD (gastroesophageal reflux disease)    Renal stones    UTI (urinary tract infection)     Past Surgical History:  Procedure Laterality Date   BREAST REDUCTION SURGERY     BREAST SURGERY     WISDOM TOOTH EXTRACTION      Family Psychiatric History: I reviewed family psychiatric history from progress note on 11/21/2021.  Family History:  Family History  Problem Relation Age of Onset   Arthritis Mother    Depression Mother    Hypertension Mother    Miscarriages / Stillbirths Mother    Arthritis Father    Hypertension Father    Prostate cancer Father    Arthritis Maternal Grandmother    Hypertension Maternal Grandmother    Ulcerative colitis Maternal Grandmother    Alcohol abuse Maternal Grandfather    COPD Paternal Grandmother    Dementia Paternal  Grandmother    Hypertension Paternal Grandfather    Dementia Paternal Grandfather    Stroke Paternal Grandfather     Social History: I have reviewed social history from progress note on 11/21/2021. Social History   Socioeconomic History   Marital status: Single    Spouse name: Not on file   Number of children: Not on file   Years of education: Not on file   Highest education level: 12th grade  Occupational  History   Occupation: bartender   Occupation: retail   Tobacco Use   Smoking status: Former    Current packs/day: 0.00    Average packs/day: 0.3 packs/day for 2.0 years (0.5 ttl pk-yrs)    Types: E-cigarettes, Cigarettes    Quit date: 07/01/2023    Years since quitting: 0.7   Smokeless tobacco: Never   Tobacco comments:    Vapes and has been for 1.5 years.   Vaping Use   Vaping status: Former   Substances: Nicotine, Flavoring   Devices: Stopped in May 2025  Substance and Sexual Activity   Alcohol use: Yes    Alcohol/week: 2.0 standard drinks of alcohol    Types: 2 Glasses of wine per week   Drug use: Never   Sexual activity: Not Currently    Partners: Male    Birth control/protection: Implant  Other Topics Concern   Not on file  Social History Narrative   Not on file   Social Drivers of Health   Tobacco Use: Medium Risk (04/04/2024)   Patient History    Smoking Tobacco Use: Former    Smokeless Tobacco Use: Never    Passive Exposure: Not on file  Financial Resource Strain: Low Risk (03/07/2024)   Overall Financial Resource Strain (CARDIA)    Difficulty of Paying Living Expenses: Not hard at all  Recent Concern: Financial Resource Strain - Medium Risk (03/07/2024)   Overall Financial Resource Strain (CARDIA)    Difficulty of Paying Living Expenses: Somewhat hard  Food Insecurity: No Food Insecurity (03/07/2024)   Epic    Worried About Programme Researcher, Broadcasting/film/video in the Last Year: Never true    Ran Out of Food in the Last Year: Never true  Recent Concern: Food Insecurity - Food Insecurity Present (03/07/2024)   Epic    Worried About Programme Researcher, Broadcasting/film/video in the Last Year: Sometimes true    Ran Out of Food in the Last Year: Never true  Transportation Needs: No Transportation Needs (03/07/2024)   Epic    Lack of Transportation (Medical): No    Lack of Transportation (Non-Medical): No  Physical Activity: Insufficiently Active (03/07/2024)   Exercise Vital Sign    Days of Exercise per Week: 3  days    Minutes of Exercise per Session: 10 min  Stress: Stress Concern Present (03/07/2024)   [employer] - Occupational Stress Questionnaire    Feeling of Stress: To some extent  Social Connections: Moderately Integrated (03/07/2024)   Social Connection and Isolation Panel    Frequency of Communication with Friends and Family: More than three times a week    Frequency of Social Gatherings with Friends and Family: More than three times a week    Attends Religious Services: More than 4 times per year    Active Member of Clubs or Organizations: No    Attends Banker Meetings: Not on file    Marital Status: Living with partner  Depression (PHQ2-9): Low Risk (04/04/2024)   Depression (PHQ2-9)  PHQ-2 Score: 1  Recent Concern: Depression (PHQ2-9) - Medium Risk (03/07/2024)   Depression (PHQ2-9)    PHQ-2 Score: 9  Alcohol Screen: Low Risk (03/07/2024)   Alcohol Screen    Last Alcohol Screening Score (AUDIT): 2  Housing: Low Risk (03/07/2024)   Epic    Unable to Pay for Housing in the Last Year: No    Number of Times Moved in the Last Year: 0    Homeless in the Last Year: No  Utilities: Not At Risk (03/07/2024)   Epic    Threatened with loss of utilities: No  Health Literacy: Adequate Health Literacy (03/07/2024)   B1300 Health Literacy    Frequency of need for help with medical instructions: Never    Allergies: Allergies[1]  Metabolic Disorder Labs: Lab Results  Component Value Date   HGBA1C 5.3 09/27/2019   No results found for: PROLACTIN Lab Results  Component Value Date   CHOL 128 09/27/2019   TRIG 56.0 09/27/2019   HDL 60.80 09/27/2019   CHOLHDL 2 09/27/2019   VLDL 11.2 09/27/2019   LDLCALC 56 09/27/2019   Lab Results  Component Value Date   TSH 1.50 09/27/2019    Therapeutic Level Labs: No results found for: LITHIUM No results found for: VALPROATE No results found for: CBMZ  Current Medications: Current Outpatient  Medications  Medication Sig Dispense Refill   Adapalene  0.3 % gel Apply 3-4 times weekly at night to dry skin after cleaning. Increase frequency up to nightly as tolerated. 59 g 5   albuterol (VENTOLIN HFA) 108 (90 Base) MCG/ACT inhaler Inhale into the lungs every 6 (six) hours as needed for wheezing or shortness of breath.     amphetamine -dextroamphetamine  (ADDERALL) 5 MG tablet Take 1 tablet (5 mg total) by mouth daily as needed. Take daily as needed at 4 PM 30 tablet 0   clindamycin  (CLINDAGEL) 1 % gel Apply topically 2 (two) times daily. Apply to the affected area of skin daily 60 g 5   clindamycin -benzoyl peroxide (BENZACLIN) gel Apply topically 2 (two) times daily. Apply to the affected area of skin once daily 50 g 5   doxycycline  (ADOXA) 100 MG tablet Take 1 tablet (100 mg total) by mouth daily. Take 100 mg  daily for 3 months 30 tablet 2   etonogestrel (NEXPLANON) 68 MG IMPL implant Inject into the skin.     [START ON 04/26/2024] lisdexamfetamine  (VYVANSE ) 50 MG capsule Take 1 capsule (50 mg total) by mouth in the morning. 90 capsule 0   meloxicam  (MOBIC ) 15 MG tablet Take 15 mg by mouth daily. As needed     omeprazole  (PRILOSEC) 20 MG capsule Take 1 capsule (20 mg total) by mouth daily. Needs appointment for future refills 30 capsule 1   spironolactone  (ALDACTONE ) 50 MG tablet Take 2 tablets (100 mg total) by mouth daily. Take 50mg  daily x1 week and increase to 100mg  daily as tolerated 60 tablet 2   No current facility-administered medications for this visit.     Musculoskeletal: Strength & Muscle Tone: within normal limits Gait & Station: normal Patient leans: N/A  Psychiatric Specialty Exam: Review of Systems  Psychiatric/Behavioral:  The patient is nervous/anxious.     Blood pressure (!) 129/93, pulse 87, temperature 97.6 F (36.4 C), temperature source Temporal, height 5' 7 (1.702 m), weight 193 lb (87.5 kg).Body mass index is 30.23 kg/m.  General Appearance: Casual  Eye  Contact:  Fair  Speech:  Clear and Coherent  Volume:  Normal  Mood:  Anxious  Affect:  Appropriate  Thought Process:  Goal Directed and Descriptions of Associations: Intact  Orientation:  Full (Time, Place, and Person)  Thought Content: Logical   Suicidal Thoughts:  No  Homicidal Thoughts:  No  Memory:  Immediate;   Fair Recent;   Fair Remote;   Fair  Judgement:  Fair  Insight:  Fair  Psychomotor Activity:  Normal  Concentration:  Concentration: Fair and Attention Span: Fair  Recall:  [employer] of Knowledge: Fair  Language: Fair  Akathisia:  No  Handed:  Right  AIMS (if indicated): not done  Assets:  Manufacturing Systems Engineer Desire for Improvement Housing Social Support Transportation  ADL's:  Intact  Cognition: WNL  Sleep:  Fair   Screenings: GAD-7    Garment/textile Technologist Visit from 04/04/2024 in [facility] Office Visit from 03/07/2024 in [facility] Office Visit from 06/09/2023 in [facility] Office Visit from 11/13/2022 in [facility] Office Visit from 06/29/2022 in [facility]  Total GAD-7 Score 5 8 1 1 2    PHQ2-9    Flowsheet Row Office Visit from 04/04/2024 in [facility] Office Visit from 03/07/2024 in [facility] Office Visit from 06/09/2023 in [facility] Office Visit from 11/13/2022 in [facility] Office Visit from 06/29/2022 in [facility]  PHQ-2 Total Score 1 2 0 0 0  PHQ-9 Total Score -- 9 -- -- 3   Flowsheet Row Office Visit from 04/04/2024 in [facility] Video Visit from 12/21/2023 in [facility] Office Visit from  08/11/2023 in [facility]  C-SSRS RISK CATEGORY No Risk No Risk No Risk     Assessment and Plan: Kelly Bennett is a 24 year old Caucasian female who presented for a follow-up appointment, discussed assessment and plan as noted below.  1. Attention deficit hyperactivity disorder (ADHD), combined type-stable Currently reports ADHD as well-managed on the current medication regimen..  Ongoing anxiety although manageable likely due to current situational stressors.  Although it does not affect day-to-day functioning. Continue Vyvanse  50 mg daily Continue Adderall 5 mg as needed. Reviewed [place] PMP AWARxE   Follow-up Follow-up in clinic in 5 months or sooner if needed.   Consent: Patient/Guardian gives verbal consent for treatment and assignment of benefits for services provided during this visit. Patient/Guardian expressed understanding and agreed to proceed.   This note was generated in part or whole with voice recognition software. Voice recognition is usually quite accurate but there are transcription errors that can and very often do occur. I apologize for any typographical errors that were not detected and corrected.    Hildred Pharo, MD 04/05/2024, 10:10 AM     [1] No Known Allergies

## 2024-04-06 ENCOUNTER — Ambulatory Visit: Admitting: Family Medicine

## 2024-04-06 ENCOUNTER — Encounter: Payer: Self-pay | Admitting: Family Medicine

## 2024-04-06 VITALS — BP 127/87 | HR 75 | Ht 67.0 in | Wt 191.4 lb

## 2024-04-06 DIAGNOSIS — Z3046 Encounter for surveillance of implantable subdermal contraceptive: Secondary | ICD-10-CM

## 2024-04-06 DIAGNOSIS — Z124 Encounter for screening for malignant neoplasm of cervix: Secondary | ICD-10-CM

## 2024-04-06 MED ORDER — LIDOCAINE-EPINEPHRINE 2 %-1:100000 IJ SOLN
20.0000 mL | Freq: Once | INTRAMUSCULAR | Status: AC
  Administered 2024-04-06: 4 mL via INTRADERMAL

## 2024-04-06 NOTE — Progress Notes (Signed)
 "  Established Patient Office Visit  Patient ID: Kelly Bennett, female    DOB: 01-Oct-2000  Age: 24 y.o. MRN: [id number] PCP: Sharma Coyer, MD  Chief Complaint  Patient presents with   Gynecologic Exam    Pap smear and nexplanon removal    Contraception    Subjective:     Gynecologic Exam    Discussed the use of AI scribe software for clinical note transcription with the patient, who gave verbal consent to proceed.  History of Present Illness Kelly Bennett is a 24 year old female who presents for cervical cancer screening and Nexplanon removal.  She is undergoing a Pap smear as part of routine cervical cancer screening. She recently had STI testing due to unusual bleeding, which returned negative results and declines additional STI screening today.   She is also having her Nexplanon removed from her left arm. She has had the implant for approximately three years and experienced significant weight gain of about forty pounds during the first year of use. She does not plan to replace the Nexplanon. During the review of symptoms, she did not report any significant pain or discomfort related to the Nexplanon. She has a history of experiencing bruising after previous implant procedures.     ROS    Objective:     BP 127/87 (BP Location: Right Arm, Patient Position: Sitting, Cuff Size: Normal)   Pulse 75   Ht 5' 7 (1.702 m)   Wt 191 lb 6.4 oz (86.8 kg)   LMP 03/22/2024 (Exact Date)   SpO2 100%   BMI 29.98 kg/m  BP Readings from Last 3 Encounters:  04/06/24 127/87  03/22/24 104/70  03/07/24 (!) 115/96   Wt Readings from Last 3 Encounters:  04/06/24 191 lb 6.4 oz (86.8 kg)  03/22/24 195 lb (88.5 kg)  03/07/24 194 lb 9.6 oz (88.3 kg)      Physical Exam Exam conducted with a chaperone present.  Constitutional:      Appearance: Normal appearance.  Pulmonary:     Effort: Pulmonary effort is normal.  Genitourinary:    General: Normal vulva.     Pubic Area:  No rash or pubic lice.      Labia:        Right: No rash, tenderness, lesion or injury.        Left: No rash, tenderness, lesion or injury.      Urethra: No urethral pain, urethral swelling or urethral lesion.     Vagina: No vaginal discharge, erythema, tenderness, bleeding or lesions.     Cervix: Normal.     Uterus: Normal.      Adnexa: Right adnexa normal and left adnexa normal.     Rectum: Normal.  Neurological:     Mental Status: She is alert.      No results found for any visits on 04/06/24.    The ASCVD Risk score (Arnett DK, et al., 2019) failed to calculate for the following reasons:   The 2019 ASCVD risk score is only valid for ages 53 to 52   * - Cholesterol units were assumed  Outpatient Encounter Medications as of 04/06/2024  Medication Sig Note   Adapalene  0.3 % gel Apply 3-4 times weekly at night to dry skin after cleaning. Increase frequency up to nightly as tolerated.    amphetamine -dextroamphetamine  (ADDERALL) 5 MG tablet Take 1 tablet (5 mg total) by mouth daily as needed. Take daily as needed at 4 PM    clindamycin -benzoyl peroxide (BENZACLIN) gel Apply  topically 2 (two) times daily. Apply to the affected area of skin once daily    doxycycline  (ADOXA) 100 MG tablet Take 1 tablet (100 mg total) by mouth daily. Take 100 mg  daily for 3 months    [START ON 04/26/2024] lisdexamfetamine  (VYVANSE ) 50 MG capsule Take 1 capsule (50 mg total) by mouth in the morning.    omeprazole  (PRILOSEC) 20 MG capsule Take 1 capsule (20 mg total) by mouth daily. Needs appointment for future refills    spironolactone  (ALDACTONE ) 50 MG tablet Take 2 tablets (100 mg total) by mouth daily. Take 50mg  daily x1 week and increase to 100mg  daily as tolerated    [DISCONTINUED] albuterol (VENTOLIN HFA) 108 (90 Base) MCG/ACT inhaler Inhale into the lungs every 6 (six) hours as needed for wheezing or shortness of breath.    [DISCONTINUED] clindamycin  (CLINDAGEL) 1 % gel Apply topically 2 (two) times  daily. Apply to the affected area of skin daily    [DISCONTINUED] etonogestrel (NEXPLANON) 68 MG IMPL implant Inject into the skin. 04/06/2024: removed 04/06/24   [DISCONTINUED] meloxicam  (MOBIC ) 15 MG tablet Take 15 mg by mouth daily. As needed    Facility-Administered Encounter Medications as of 04/06/2024  Medication   lidocaine -EPINEPHrine  (XYLOCAINE  W/EPI) 2 %-1:100000 (with pres) injection 20 mL       Assessment & Plan:   Problem List Items Addressed This Visit   None Visit Diagnoses       Nexplanon removal    -  Primary   Relevant Medications   lidocaine -EPINEPHrine  (XYLOCAINE  W/EPI) 2 %-1:100000 (with pres) injection 20 mL     Encounter for Papanicolaou smear for cervical cancer screening       Relevant Orders   Cytology - PAP       Assessment and Plan Assessment & Plan Woman's Wellness Visit Routine cervical cancer screening with Pap smear performed. Cervical tissue appears healthy with no abnormalities. Discussed potential for spotting post-procedure, which should not last more than a day. STI testing declined as recent testing was performed due to abnormal bleeding. - Performed Pap smear for cervical cancer screening - Advised on potential spotting post-Pap smear - Discussed STI testing options, declined by patient  Nexplanon removal Nexplanon implant in the left arm was removed. Procedure involved cleaning, numbing, incision, and removal of the implant. She expressed desire not to replace the implant. Discussed potential for pregnancy post-removal and advised caution during Valentine's season. Informed about signs of infection post-procedure and advised to report any redness, warmth, or drainage. - Removed Nexplanon implant from left arm - Advised on potential for pregnancy post-removal - Instructed to monitor for signs of infection and report if redness, warmth, or drainage occurs    PROCEDURE NOTE: NEXPLANON  REMOVAL Patient given informed consent and signed copy in  the chart.   LEFT arm area prepped and draped in the usual sterile fashion. Three cc of lidocaine  without epinephrine  1% used for local anesthesia. A small stab incision was made close to the nexplanon with scalpel. Hemostats were used to withdraw the nexplanon. A steri strip was applied.  No complications.Patient given follow up instructions should she experience redness, swelling at sight or fever in the next 24 hours. Patient was reminded this totally removes her nexplanon contraceptive devise. (she can now potentially conceive)    Return if symptoms worsen or fail to improve.    Rockie Agent, MD [facility]  "

## 2024-04-06 NOTE — Patient Instructions (Signed)
 To keep you healthy, please keep in mind the following health maintenance items that you are due for:   Health Maintenance Due  Topic Date Due   COVID-19 Vaccine (1) Never done   Pneumococcal Vaccine (1 of 1 - PPSV23, PCV20, or PCV21) 08/28/2004   Cervical Cancer Screening (Pap smear)  Never done     Best Wishes,   Dr. Lang

## 2024-04-18 ENCOUNTER — Encounter

## 2024-05-04 ENCOUNTER — Ambulatory Visit

## 2024-09-05 ENCOUNTER — Ambulatory Visit: Admitting: Psychiatry
